# Patient Record
Sex: Male | Born: 1980 | Race: Black or African American | Hispanic: No | Marital: Single | Smoking: Never smoker
Health system: Southern US, Community
[De-identification: ages and names within clinical notes are randomized; demographics above are authoritative.]

## PROBLEM LIST (undated history)

## (undated) DIAGNOSIS — A539 Syphilis, unspecified: Secondary | ICD-10-CM

## (undated) DIAGNOSIS — Z21 Asymptomatic human immunodeficiency virus [HIV] infection status: Secondary | ICD-10-CM

## (undated) DIAGNOSIS — T7840XA Allergy, unspecified, initial encounter: Secondary | ICD-10-CM

## (undated) DIAGNOSIS — B20 Human immunodeficiency virus [HIV] disease: Secondary | ICD-10-CM

## (undated) HISTORY — DX: Allergy, unspecified, initial encounter: T78.40XA

## (undated) HISTORY — DX: Syphilis, unspecified: A53.9

## (undated) HISTORY — DX: Asymptomatic human immunodeficiency virus (hiv) infection status: Z21

## (undated) HISTORY — DX: Human immunodeficiency virus (HIV) disease: B20

---

## 2004-07-15 ENCOUNTER — Emergency Department (HOSPITAL_COMMUNITY): Admission: EM | Admit: 2004-07-15 | Discharge: 2004-07-16 | Payer: Self-pay | Admitting: Emergency Medicine

## 2005-02-07 ENCOUNTER — Emergency Department (HOSPITAL_COMMUNITY): Admission: EM | Admit: 2005-02-07 | Discharge: 2005-02-07 | Payer: Self-pay | Admitting: Emergency Medicine

## 2006-07-10 ENCOUNTER — Encounter: Admission: RE | Admit: 2006-07-10 | Discharge: 2006-07-10 | Payer: Self-pay | Admitting: Internal Medicine

## 2006-07-10 ENCOUNTER — Ambulatory Visit: Payer: Self-pay | Admitting: Internal Medicine

## 2006-07-10 ENCOUNTER — Encounter (INDEPENDENT_AMBULATORY_CARE_PROVIDER_SITE_OTHER): Payer: Self-pay | Admitting: *Deleted

## 2006-07-10 LAB — CONVERTED CEMR LAB
CD4 Count: 400 microliters
CD4 T Cell Abs: 400
HIV 1 RNA Quant: 35700 copies/mL

## 2006-07-25 ENCOUNTER — Ambulatory Visit: Payer: Self-pay | Admitting: Internal Medicine

## 2006-09-10 ENCOUNTER — Ambulatory Visit: Payer: Self-pay | Admitting: Internal Medicine

## 2006-09-29 DIAGNOSIS — B20 Human immunodeficiency virus [HIV] disease: Secondary | ICD-10-CM

## 2006-09-29 DIAGNOSIS — K921 Melena: Secondary | ICD-10-CM

## 2006-10-02 ENCOUNTER — Encounter (INDEPENDENT_AMBULATORY_CARE_PROVIDER_SITE_OTHER): Payer: Self-pay | Admitting: *Deleted

## 2006-10-02 ENCOUNTER — Encounter: Admission: RE | Admit: 2006-10-02 | Discharge: 2006-10-02 | Payer: Self-pay | Admitting: Internal Medicine

## 2006-10-02 ENCOUNTER — Ambulatory Visit: Payer: Self-pay | Admitting: Internal Medicine

## 2006-10-02 LAB — CONVERTED CEMR LAB
CD4 Count: 420 microliters
HIV 1 RNA Quant: 14400 copies/mL

## 2006-10-24 ENCOUNTER — Ambulatory Visit: Payer: Self-pay | Admitting: Internal Medicine

## 2006-12-23 ENCOUNTER — Encounter: Payer: Self-pay | Admitting: Internal Medicine

## 2006-12-26 ENCOUNTER — Ambulatory Visit: Payer: Self-pay | Admitting: Internal Medicine

## 2007-01-12 ENCOUNTER — Ambulatory Visit: Payer: Self-pay | Admitting: Internal Medicine

## 2007-01-12 ENCOUNTER — Encounter: Admission: RE | Admit: 2007-01-12 | Discharge: 2007-01-12 | Payer: Self-pay | Admitting: Internal Medicine

## 2007-01-12 ENCOUNTER — Encounter (INDEPENDENT_AMBULATORY_CARE_PROVIDER_SITE_OTHER): Payer: Self-pay | Admitting: *Deleted

## 2007-01-12 LAB — CONVERTED CEMR LAB
ALT: 14 units/L (ref 0–53)
AST: 16 units/L (ref 0–37)
Albumin: 4.1 g/dL (ref 3.5–5.2)
Basophils Absolute: 0 10*3/uL (ref 0.0–0.1)
Basophils Relative: 1 % (ref 0–1)
Calcium: 9.5 mg/dL (ref 8.4–10.5)
Creatinine, Ser: 0.88 mg/dL (ref 0.40–1.50)
Eosinophils Absolute: 0 10*3/uL (ref 0.0–0.7)
Eosinophils Relative: 1 % (ref 0–5)
HCT: 41.4 % (ref 39.0–52.0)
Lymphocytes Relative: 66 % — ABNORMAL HIGH (ref 12–46)
Neutro Abs: 0.9 10*3/uL — ABNORMAL LOW (ref 1.7–7.7)
Potassium: 3.9 meq/L (ref 3.5–5.3)

## 2007-01-19 ENCOUNTER — Encounter (INDEPENDENT_AMBULATORY_CARE_PROVIDER_SITE_OTHER): Payer: Self-pay | Admitting: *Deleted

## 2007-01-19 LAB — CONVERTED CEMR LAB

## 2007-01-23 ENCOUNTER — Ambulatory Visit: Payer: Self-pay | Admitting: Internal Medicine

## 2007-02-01 ENCOUNTER — Encounter (INDEPENDENT_AMBULATORY_CARE_PROVIDER_SITE_OTHER): Payer: Self-pay | Admitting: *Deleted

## 2007-02-06 ENCOUNTER — Ambulatory Visit: Payer: Self-pay | Admitting: Internal Medicine

## 2007-03-27 ENCOUNTER — Encounter: Payer: Self-pay | Admitting: Internal Medicine

## 2007-03-27 ENCOUNTER — Encounter: Admission: RE | Admit: 2007-03-27 | Discharge: 2007-03-27 | Payer: Self-pay | Admitting: Infectious Diseases

## 2007-03-27 ENCOUNTER — Ambulatory Visit: Payer: Self-pay | Admitting: Infectious Diseases

## 2007-03-27 LAB — CONVERTED CEMR LAB
Alkaline Phosphatase: 73 units/L (ref 39–117)
Calcium: 9.1 mg/dL (ref 8.4–10.5)
Creatinine, Ser: 0.97 mg/dL (ref 0.40–1.50)
Eosinophils Relative: 1 % (ref 0–5)
HCT: 41.6 % (ref 39.0–52.0)
HIV-1 RNA Quant, Log: 3.94 — ABNORMAL HIGH (ref <1.70)
Lymphocytes Relative: 55 % — ABNORMAL HIGH (ref 12–46)
MCHC: 33.7 g/dL (ref 30.0–36.0)
Monocytes Absolute: 0.5 10*3/uL (ref 0.2–0.7)
Monocytes Relative: 13 % — ABNORMAL HIGH (ref 3–11)
Neutro Abs: 1.2 10*3/uL — ABNORMAL LOW (ref 1.7–7.7)
Potassium: 3.8 meq/L (ref 3.5–5.3)
Total Bilirubin: 0.7 mg/dL (ref 0.3–1.2)
Total Protein: 8.2 g/dL (ref 6.0–8.3)
WBC: 3.8 10*3/uL — ABNORMAL LOW (ref 4.0–10.5)

## 2007-04-10 ENCOUNTER — Ambulatory Visit: Payer: Self-pay | Admitting: Internal Medicine

## 2007-04-10 DIAGNOSIS — L738 Other specified follicular disorders: Secondary | ICD-10-CM

## 2007-07-01 ENCOUNTER — Ambulatory Visit: Payer: Self-pay | Admitting: Internal Medicine

## 2007-07-01 ENCOUNTER — Encounter: Admission: RE | Admit: 2007-07-01 | Discharge: 2007-07-01 | Payer: Self-pay | Admitting: Internal Medicine

## 2007-07-01 LAB — CONVERTED CEMR LAB
ALT: 22 units/L (ref 0–53)
Alkaline Phosphatase: 79 units/L (ref 39–117)
BUN: 17 mg/dL (ref 6–23)
Calcium: 9.5 mg/dL (ref 8.4–10.5)
Creatinine, Ser: 0.97 mg/dL (ref 0.40–1.50)
Eosinophils Absolute: 0.1 10*3/uL (ref 0.0–0.7)
Eosinophils Relative: 1 % (ref 0–5)
HCT: 44.5 % (ref 39.0–52.0)
MCHC: 33.3 g/dL (ref 30.0–36.0)
MCV: 85.2 fL (ref 78.0–100.0)
Monocytes Absolute: 0.5 10*3/uL (ref 0.2–0.7)
Neutro Abs: 1.3 10*3/uL — ABNORMAL LOW (ref 1.7–7.7)
Potassium: 3.9 meq/L (ref 3.5–5.3)
RBC: 5.22 M/uL (ref 4.22–5.81)
RDW: 13.2 % (ref 11.5–14.0)
Total Bilirubin: 0.5 mg/dL (ref 0.3–1.2)
Total Protein: 8.2 g/dL (ref 6.0–8.3)
WBC: 4.2 10*3/uL (ref 4.0–10.5)

## 2007-07-06 ENCOUNTER — Telehealth: Payer: Self-pay | Admitting: Internal Medicine

## 2007-07-10 ENCOUNTER — Encounter (INDEPENDENT_AMBULATORY_CARE_PROVIDER_SITE_OTHER): Payer: Self-pay | Admitting: *Deleted

## 2007-08-07 ENCOUNTER — Ambulatory Visit: Payer: Self-pay | Admitting: Internal Medicine

## 2007-08-07 DIAGNOSIS — A630 Anogenital (venereal) warts: Secondary | ICD-10-CM | POA: Insufficient documentation

## 2007-11-02 ENCOUNTER — Encounter (INDEPENDENT_AMBULATORY_CARE_PROVIDER_SITE_OTHER): Payer: Self-pay | Admitting: *Deleted

## 2007-11-02 ENCOUNTER — Ambulatory Visit: Payer: Self-pay | Admitting: Internal Medicine

## 2007-11-02 ENCOUNTER — Encounter: Admission: RE | Admit: 2007-11-02 | Discharge: 2007-11-02 | Payer: Self-pay | Admitting: Internal Medicine

## 2007-11-02 LAB — CONVERTED CEMR LAB
ALT: 18 units/L (ref 0–53)
BUN: 13 mg/dL (ref 6–23)
Basophils Absolute: 0 10*3/uL (ref 0.0–0.1)
Basophils Relative: 1 % (ref 0–1)
Chloride: 106 meq/L (ref 96–112)
Eosinophils Absolute: 0 10*3/uL — ABNORMAL LOW (ref 0.2–0.7)
Eosinophils Relative: 1 % (ref 0–5)
HIV-1 RNA Quant, Log: 3.55 — ABNORMAL HIGH (ref <1.70)
Hemoglobin: 14.1 g/dL (ref 13.0–17.0)
Lymphs Abs: 2 10*3/uL (ref 0.7–4.0)
Monocytes Absolute: 0.4 10*3/uL (ref 0.1–1.0)
Monocytes Relative: 12 % (ref 3–12)
Neutrophils Relative %: 29 % — ABNORMAL LOW (ref 43–77)
RDW: 13.5 % (ref 11.5–15.5)
Sodium: 141 meq/L (ref 135–145)
Total Protein: 8.3 g/dL (ref 6.0–8.3)

## 2007-11-24 ENCOUNTER — Encounter: Payer: Self-pay | Admitting: Internal Medicine

## 2007-11-25 ENCOUNTER — Encounter (INDEPENDENT_AMBULATORY_CARE_PROVIDER_SITE_OTHER): Payer: Self-pay | Admitting: *Deleted

## 2007-11-25 ENCOUNTER — Ambulatory Visit: Payer: Self-pay | Admitting: Internal Medicine

## 2007-11-25 DIAGNOSIS — R110 Nausea: Secondary | ICD-10-CM

## 2007-12-12 ENCOUNTER — Encounter: Payer: Self-pay | Admitting: Internal Medicine

## 2007-12-18 ENCOUNTER — Telehealth (INDEPENDENT_AMBULATORY_CARE_PROVIDER_SITE_OTHER): Payer: Self-pay | Admitting: *Deleted

## 2007-12-18 ENCOUNTER — Encounter (INDEPENDENT_AMBULATORY_CARE_PROVIDER_SITE_OTHER): Payer: Self-pay | Admitting: *Deleted

## 2007-12-31 ENCOUNTER — Telehealth: Payer: Self-pay

## 2008-01-01 ENCOUNTER — Ambulatory Visit: Payer: Self-pay | Admitting: Internal Medicine

## 2008-01-01 DIAGNOSIS — T148 Other injury of unspecified body region: Secondary | ICD-10-CM

## 2008-01-01 DIAGNOSIS — W57XXXA Bitten or stung by nonvenomous insect and other nonvenomous arthropods, initial encounter: Secondary | ICD-10-CM

## 2008-01-01 DIAGNOSIS — J309 Allergic rhinitis, unspecified: Secondary | ICD-10-CM | POA: Insufficient documentation

## 2008-02-24 ENCOUNTER — Encounter: Admission: RE | Admit: 2008-02-24 | Discharge: 2008-02-24 | Payer: Self-pay | Admitting: Internal Medicine

## 2008-02-24 ENCOUNTER — Ambulatory Visit: Payer: Self-pay | Admitting: Internal Medicine

## 2008-02-24 LAB — CONVERTED CEMR LAB
AST: 18 units/L (ref 0–37)
Alkaline Phosphatase: 77 units/L (ref 39–117)
BUN: 17 mg/dL (ref 6–23)
Basophils Absolute: 0 10*3/uL (ref 0.0–0.1)
Basophils Relative: 1 % (ref 0–1)
Calcium: 9.5 mg/dL (ref 8.4–10.5)
Chloride: 104 meq/L (ref 96–112)
Creatinine, Ser: 0.85 mg/dL (ref 0.40–1.50)
Glucose, Bld: 89 mg/dL (ref 70–99)
HIV 1 RNA Quant: 1880 copies/mL — ABNORMAL HIGH (ref <50)
Hemoglobin: 14.3 g/dL (ref 13.0–17.0)
Lymphs Abs: 2 10*3/uL (ref 0.7–4.0)
MCHC: 33.9 g/dL (ref 30.0–36.0)
MCV: 85.6 fL (ref 78.0–100.0)
Monocytes Relative: 12 % (ref 3–12)
Potassium: 4.2 meq/L (ref 3.5–5.3)
Sodium: 140 meq/L (ref 135–145)

## 2008-03-09 ENCOUNTER — Ambulatory Visit: Payer: Self-pay | Admitting: Internal Medicine

## 2008-03-29 ENCOUNTER — Encounter: Admission: RE | Admit: 2008-03-29 | Discharge: 2008-03-29 | Payer: Self-pay | Admitting: Internal Medicine

## 2008-03-29 ENCOUNTER — Telehealth: Payer: Self-pay | Admitting: Infectious Diseases

## 2008-03-29 ENCOUNTER — Ambulatory Visit: Payer: Self-pay | Admitting: Internal Medicine

## 2008-03-29 DIAGNOSIS — L259 Unspecified contact dermatitis, unspecified cause: Secondary | ICD-10-CM | POA: Insufficient documentation

## 2008-04-01 ENCOUNTER — Ambulatory Visit: Payer: Self-pay | Admitting: Internal Medicine

## 2008-04-01 DIAGNOSIS — A539 Syphilis, unspecified: Secondary | ICD-10-CM

## 2008-04-08 ENCOUNTER — Ambulatory Visit: Payer: Self-pay | Admitting: Internal Medicine

## 2008-04-15 ENCOUNTER — Ambulatory Visit: Payer: Self-pay | Admitting: Internal Medicine

## 2008-06-07 ENCOUNTER — Ambulatory Visit: Payer: Self-pay | Admitting: Internal Medicine

## 2008-06-07 ENCOUNTER — Encounter: Admission: RE | Admit: 2008-06-07 | Discharge: 2008-06-07 | Payer: Self-pay | Admitting: Internal Medicine

## 2008-06-07 LAB — CONVERTED CEMR LAB
AST: 17 units/L (ref 0–37)
Albumin: 4.5 g/dL (ref 3.5–5.2)
Basophils Relative: 0 % (ref 0–1)
CO2: 23 meq/L (ref 19–32)
Creatinine, Ser: 0.93 mg/dL (ref 0.40–1.50)
Eosinophils Absolute: 0 10*3/uL (ref 0.0–0.7)
HCT: 41 % (ref 39.0–52.0)
HIV-1 RNA Quant, Log: 3.51 — ABNORMAL HIGH (ref <1.70)
Hemoglobin: 14.2 g/dL (ref 13.0–17.0)
Lymphocytes Relative: 51 % — ABNORMAL HIGH (ref 12–46)
MCHC: 34.6 g/dL (ref 30.0–36.0)
MCV: 81.8 fL (ref 78.0–100.0)
Monocytes Relative: 9 % (ref 3–12)
Neutro Abs: 1.8 10*3/uL (ref 1.7–7.7)
Platelets: 223 10*3/uL (ref 150–400)
Potassium: 4.4 meq/L (ref 3.5–5.3)
RDW: 13.2 % (ref 11.5–15.5)

## 2008-09-23 ENCOUNTER — Encounter (INDEPENDENT_AMBULATORY_CARE_PROVIDER_SITE_OTHER): Payer: Self-pay | Admitting: Licensed Clinical Social Worker

## 2008-09-23 ENCOUNTER — Ambulatory Visit: Payer: Self-pay | Admitting: Internal Medicine

## 2008-09-26 ENCOUNTER — Ambulatory Visit: Payer: Self-pay | Admitting: Internal Medicine

## 2008-09-26 LAB — CONVERTED CEMR LAB
ALT: 13 units/L (ref 0–53)
AST: 19 units/L (ref 0–37)
Basophils Absolute: 0 10*3/uL (ref 0.0–0.1)
Basophils Relative: 0 % (ref 0–1)
CO2: 22 meq/L (ref 19–32)
Creatinine, Ser: 0.9 mg/dL (ref 0.40–1.50)
Eosinophils Relative: 1 % (ref 0–5)
HIV 1 RNA Quant: 3590 copies/mL — ABNORMAL HIGH (ref <50)
HIV-1 RNA Quant, Log: 3.56 — ABNORMAL HIGH (ref <1.70)
Hemoglobin: 13.7 g/dL (ref 13.0–17.0)
Lymphocytes Relative: 58 % — ABNORMAL HIGH (ref 12–46)
Lymphs Abs: 2.3 10*3/uL (ref 0.7–4.0)
MCV: 85 fL (ref 78.0–100.0)
Monocytes Absolute: 0.5 10*3/uL (ref 0.1–1.0)
Monocytes Relative: 12 % (ref 3–12)
Neutro Abs: 1.1 10*3/uL — ABNORMAL LOW (ref 1.7–7.7)
Platelets: 226 10*3/uL (ref 150–400)
Potassium: 4.5 meq/L (ref 3.5–5.3)
Sodium: 139 meq/L (ref 135–145)

## 2008-10-12 ENCOUNTER — Ambulatory Visit: Payer: Self-pay | Admitting: Internal Medicine

## 2008-10-13 ENCOUNTER — Encounter (INDEPENDENT_AMBULATORY_CARE_PROVIDER_SITE_OTHER): Payer: Self-pay | Admitting: *Deleted

## 2009-01-10 ENCOUNTER — Ambulatory Visit: Payer: Self-pay | Admitting: Internal Medicine

## 2009-01-10 LAB — CONVERTED CEMR LAB
AST: 14 units/L (ref 0–37)
Alkaline Phosphatase: 73 units/L (ref 39–117)
BUN: 14 mg/dL (ref 6–23)
Basophils Absolute: 0 10*3/uL (ref 0.0–0.1)
Basophils Relative: 1 % (ref 0–1)
Calcium: 9.8 mg/dL (ref 8.4–10.5)
Chloride: 106 meq/L (ref 96–112)
HCT: 41.1 % (ref 39.0–52.0)
HIV-1 RNA Quant, Log: 3.46 — ABNORMAL HIGH (ref <1.68)
Hemoglobin: 14.4 g/dL (ref 13.0–17.0)
Lymphocytes Relative: 39 % (ref 12–46)
Lymphs Abs: 1.6 10*3/uL (ref 0.7–4.0)
Monocytes Absolute: 0.4 10*3/uL (ref 0.1–1.0)
Potassium: 4.2 meq/L (ref 3.5–5.3)
RBC: 4.92 M/uL (ref 4.22–5.81)
RDW: 12.7 % (ref 11.5–15.5)
Total Bilirubin: 0.4 mg/dL (ref 0.3–1.2)
Total Protein: 8.2 g/dL (ref 6.0–8.3)

## 2009-01-25 ENCOUNTER — Ambulatory Visit: Payer: Self-pay | Admitting: Internal Medicine

## 2009-01-25 DIAGNOSIS — J029 Acute pharyngitis, unspecified: Secondary | ICD-10-CM

## 2009-01-25 LAB — CONVERTED CEMR LAB

## 2009-04-18 ENCOUNTER — Ambulatory Visit: Payer: Self-pay | Admitting: Internal Medicine

## 2009-05-03 ENCOUNTER — Telehealth: Payer: Self-pay | Admitting: Internal Medicine

## 2009-05-26 ENCOUNTER — Ambulatory Visit: Payer: Self-pay | Admitting: Internal Medicine

## 2009-05-26 LAB — CONVERTED CEMR LAB
ALT: 17 units/L (ref 0–53)
Alkaline Phosphatase: 84 units/L (ref 39–117)
Basophils Relative: 1 % (ref 0–1)
CO2: 25 meq/L (ref 19–32)
Creatinine, Ser: 0.85 mg/dL (ref 0.40–1.50)
HCT: 37.9 % — ABNORMAL LOW (ref 39.0–52.0)
Lymphocytes Relative: 46 % (ref 12–46)
Lymphs Abs: 1.6 10*3/uL (ref 0.7–4.0)
MCHC: 35.6 g/dL (ref 30.0–36.0)
MCV: 81.9 fL (ref 78.0–100.0)
Monocytes Absolute: 0.3 10*3/uL (ref 0.1–1.0)
Neutro Abs: 1.5 10*3/uL — ABNORMAL LOW (ref 1.7–7.7)
Sodium: 140 meq/L (ref 135–145)
Total Bilirubin: 0.3 mg/dL (ref 0.3–1.2)
WBC: 3.4 10*3/uL — ABNORMAL LOW (ref 4.0–10.5)

## 2009-07-21 ENCOUNTER — Telehealth (INDEPENDENT_AMBULATORY_CARE_PROVIDER_SITE_OTHER): Payer: Self-pay | Admitting: Licensed Clinical Social Worker

## 2011-03-12 LAB — T-HELPER CELL (CD4) - (RCID CLINIC ONLY)
CD4 % Helper T Cell: 19 % — ABNORMAL LOW (ref 33–55)
CD4 T Cell Abs: 290 uL — ABNORMAL LOW (ref 400–2700)

## 2011-06-14 DIAGNOSIS — Z8619 Personal history of other infectious and parasitic diseases: Secondary | ICD-10-CM | POA: Insufficient documentation

## 2011-08-20 LAB — T-HELPER CELL (CD4) - (RCID CLINIC ONLY)
CD4 % Helper T Cell: 15 — ABNORMAL LOW
CD4 T Cell Abs: 310 — ABNORMAL LOW

## 2011-08-22 LAB — T-HELPER CELL (CD4) - (RCID CLINIC ONLY): CD4 T Cell Abs: 320 — ABNORMAL LOW

## 2011-09-02 LAB — T-HELPER CELL (CD4) - (RCID CLINIC ONLY)
CD4 % Helper T Cell: 16 — ABNORMAL LOW
CD4 T Cell Abs: 320 — ABNORMAL LOW

## 2011-09-09 LAB — T-HELPER CELL (CD4) - (RCID CLINIC ONLY): CD4 T Cell Abs: 350 — ABNORMAL LOW

## 2011-12-12 ENCOUNTER — Ambulatory Visit (INDEPENDENT_AMBULATORY_CARE_PROVIDER_SITE_OTHER): Payer: Self-pay

## 2011-12-12 DIAGNOSIS — B20 Human immunodeficiency virus [HIV] disease: Secondary | ICD-10-CM

## 2011-12-12 DIAGNOSIS — Z79899 Other long term (current) drug therapy: Secondary | ICD-10-CM

## 2011-12-12 DIAGNOSIS — Z8619 Personal history of other infectious and parasitic diseases: Secondary | ICD-10-CM

## 2011-12-12 DIAGNOSIS — Z113 Encounter for screening for infections with a predominantly sexual mode of transmission: Secondary | ICD-10-CM

## 2011-12-12 LAB — URINALYSIS
Glucose, UA: NEGATIVE mg/dL
Ketones, ur: NEGATIVE mg/dL
Protein, ur: NEGATIVE mg/dL
Specific Gravity, Urine: 1.029 (ref 1.005–1.030)
Urobilinogen, UA: 0.2 mg/dL (ref 0.0–1.0)

## 2011-12-12 LAB — CBC WITH DIFFERENTIAL/PLATELET
Basophils Relative: 1 % (ref 0–1)
Eosinophils Absolute: 0 10*3/uL (ref 0.0–0.7)
HCT: 38.7 % — ABNORMAL LOW (ref 39.0–52.0)
Hemoglobin: 13.3 g/dL (ref 13.0–17.0)
MCH: 28.4 pg (ref 26.0–34.0)
MCV: 82.7 fL (ref 78.0–100.0)
Neutro Abs: 1.2 10*3/uL — ABNORMAL LOW (ref 1.7–7.7)
Neutrophils Relative %: 35 % — ABNORMAL LOW (ref 43–77)
Platelets: 237 10*3/uL (ref 150–400)

## 2011-12-12 LAB — COMPLETE METABOLIC PANEL WITH GFR
ALT: 12 U/L (ref 0–53)
Alkaline Phosphatase: 61 U/L (ref 39–117)
BUN: 11 mg/dL (ref 6–23)
Creat: 0.95 mg/dL (ref 0.50–1.35)
Glucose, Bld: 79 mg/dL (ref 70–99)
Potassium: 3.9 mEq/L (ref 3.5–5.3)
Total Protein: 7.4 g/dL (ref 6.0–8.3)

## 2011-12-12 LAB — LIPID PANEL: Cholesterol: 167 mg/dL (ref 0–200)

## 2011-12-13 LAB — GC/CHLAMYDIA PROBE AMP, URINE
Chlamydia, Swab/Urine, PCR: NEGATIVE
GC Probe Amp, Urine: NEGATIVE

## 2011-12-13 LAB — HEPATITIS A ANTIBODY, TOTAL: Hep A Total Ab: NEGATIVE

## 2011-12-13 LAB — HEPATITIS B CORE ANTIBODY, TOTAL: Hep B Core Total Ab: NEGATIVE

## 2011-12-13 LAB — T-HELPER CELL (CD4) - (RCID CLINIC ONLY): CD4 T Cell Abs: 450 uL (ref 400–2700)

## 2011-12-13 LAB — RPR

## 2011-12-19 MED ORDER — ATAZANAVIR SULFATE 200 MG PO CAPS: 400.0000 mg | ORAL_CAPSULE | Freq: Every day | ORAL | Status: DC

## 2011-12-19 MED ORDER — ABACAVIR SULFATE-LAMIVUDINE 600-300 MG PO TABS: 1.0000 | ORAL_TABLET | Freq: Every day | ORAL | Status: DC

## 2011-12-19 NOTE — Progress Notes (Signed)
Patient returning after moving care to [facility].  Record support his non adherence which was the same as before the transfer. He is transferring his care due to transportation issues.

## 2011-12-24 LAB — HIV-1 GENOTYPR PLUS

## 2011-12-26 ENCOUNTER — Encounter: Payer: Self-pay | Admitting: Internal Medicine

## 2011-12-26 ENCOUNTER — Ambulatory Visit (INDEPENDENT_AMBULATORY_CARE_PROVIDER_SITE_OTHER): Payer: Self-pay | Admitting: Internal Medicine

## 2011-12-26 DIAGNOSIS — B20 Human immunodeficiency virus [HIV] disease: Secondary | ICD-10-CM

## 2011-12-27 ENCOUNTER — Encounter: Payer: Self-pay | Admitting: Internal Medicine

## 2011-12-27 LAB — T-HELPER CELLS (CD4) COUNT (NOT AT ARMC): CD4 T Cell Abs: 428

## 2011-12-27 NOTE — Assessment & Plan Note (Signed)
I discussed with the patient at length the process of developing resistance if medication not taken daily.  The patient is very aware of the consequences and is not interested in trying to take the medications daily.  After further discussion of the outcome of untreated HIV, the likelihood of resistance with poor compliance, the patient has opted to not pursue therapy at this time.  He will continue to think about therapy and return in 1 month for further discussion.  No indication for prophylaxis at this time.

## 2011-12-27 NOTE — Progress Notes (Signed)
  Subjective:    Patient ID: Cole Thomas, male    DOB: 1981/04/24, 31 y.o.   MRN: [id number]  HPI 31 yo with HIV and previously being followed at [facility] on a regimen of Epzicom and unboosted Reyataz here to establish care.  He has been out of medications for several months and is changing due to lack of transportation.  He has no complaints at this time.  He was previously on Atripla but devloped a rash, then felt N/V with Truvada but has tolerated Epzicom and Reyataz well. No recent hospitalizations, no OIs, no STIs.  He tells me that when he is on medications he frequently misses doses and does not feel he will be able to take his regimen daily.  He does not seem concerned about a poor outcome.     Review of Systems  Constitutional: Negative for fever, chills, fatigue and unexpected weight change.  HENT: Negative for sore throat and trouble swallowing.   Respiratory: Negative for cough, shortness of breath and wheezing.   Cardiovascular: Negative for chest pain, palpitations and leg swelling.  Gastrointestinal: Negative for nausea, vomiting and abdominal pain.  Genitourinary: Negative for discharge and penile pain.  Musculoskeletal: Negative for myalgias and arthralgias.  Skin: Negative for pallor and rash.  Neurological: Negative for dizziness, syncope, weakness, light-headedness and headaches.  Hematological: Negative for adenopathy.  Psychiatric/Behavioral: Negative for dysphoric mood. The patient is not nervous/anxious.        Objective:   Physical Exam  Constitutional: He is oriented to person, place, and time. He appears well-developed and well-nourished. No distress.  HENT:  Mouth/Throat: Oropharynx is clear and moist. No oropharyngeal exudate.  Cardiovascular: Normal rate, regular rhythm and normal heart sounds.  Exam reveals no gallop and no friction rub.   No murmur Thomas. Pulmonary/Chest: Effort normal and breath sounds normal. No respiratory distress. He has no wheezes. He has  no rales.  Abdominal: Soft. Bowel sounds are normal. He exhibits no distension. There is no tenderness. There is no rebound.  Lymphadenopathy:    He has no cervical adenopathy.  Neurological: He is alert and oriented to person, place, and time.  Skin: Skin is warm and dry. No rash noted. No erythema.  Psychiatric: He has a normal mood and affect. His behavior is normal. Judgment and thought content normal.          Assessment & Plan:

## 2012-01-21 LAB — RPR: RPR Ser Ql: NONREACTIVE

## 2012-01-23 ENCOUNTER — Encounter: Payer: Self-pay | Admitting: Internal Medicine

## 2012-01-23 ENCOUNTER — Ambulatory Visit: Payer: Self-pay

## 2012-01-23 ENCOUNTER — Ambulatory Visit (INDEPENDENT_AMBULATORY_CARE_PROVIDER_SITE_OTHER): Payer: Self-pay | Admitting: Internal Medicine

## 2012-01-23 VITALS — BP 111/71 | HR 77 | Temp 98.0°F | Ht 67.0 in | Wt 152.0 lb

## 2012-01-23 DIAGNOSIS — B20 Human immunodeficiency virus [HIV] disease: Secondary | ICD-10-CM

## 2012-01-23 NOTE — Progress Notes (Signed)
  Subjective:    Patient ID: Cole Thomas, male    DOB: 02/25/1981, 31 y.o.   MRN: [id number]  HPI Here for follow up of 042.  He was seen as a new patient last month as a transfer from [facility] and had been on Reyataz and Epzicom but stopped taking it.  He had previously taken it with frequent missed doses and decided he was not "ready".  He returns to continue to discuss medication.  He tells me he is still not ready to make a "commitment" to taking medicine daily.     Review of Systems  Constitutional: Negative for fever, chills and unexpected weight change.  HENT: Negative for sore throat and trouble swallowing.   Respiratory: Negative for cough, chest tightness, shortness of breath and wheezing.   Cardiovascular: Negative for chest pain, palpitations and leg swelling.  Gastrointestinal: Negative for nausea, vomiting, abdominal pain and diarrhea.  Genitourinary: Negative for discharge and penile swelling.  Musculoskeletal: Negative for myalgias and arthralgias.  Skin: Negative for pallor and rash.  Neurological: Negative for weakness and headaches.  Hematological: Negative for adenopathy.  Psychiatric/Behavioral: Negative for sleep disturbance and dysphoric mood. The patient is not nervous/anxious.        Objective:   Physical Exam  Constitutional: He appears well-developed and well-nourished. No distress.  HENT:  Mouth/Throat: Oropharynx is clear and moist. No oropharyngeal exudate.  Cardiovascular: Normal rate, regular rhythm and normal heart sounds.  Exam reveals no gallop and no friction rub.   No murmur heard. Pulmonary/Chest: Breath sounds normal. No respiratory distress. He has no wheezes. He has no rales.  Abdominal: Soft. Bowel sounds are normal. He exhibits no distension. There is no tenderness. There is no rebound.  Lymphadenopathy:    He has no cervical adenopathy.          Assessment & Plan:

## 2012-01-23 NOTE — Assessment & Plan Note (Addendum)
He continues to not want to take ARV therapy.  I extensively warned him of the perils of this approach including the risk of death, severe illness and likely his CD4 cells will decrease more rapidly because he had been on therapy before.  He voiced his understanding and still does not want to start.  He will return in 6 months for further discussion and can return sooner if he changes his mind.    He was reminded to use condoms with all sex.

## 2012-07-09 ENCOUNTER — Other Ambulatory Visit: Payer: Self-pay

## 2012-07-22 ENCOUNTER — Other Ambulatory Visit (INDEPENDENT_AMBULATORY_CARE_PROVIDER_SITE_OTHER): Payer: Managed Care, Other (non HMO)

## 2012-07-22 DIAGNOSIS — B20 Human immunodeficiency virus [HIV] disease: Secondary | ICD-10-CM

## 2012-07-23 ENCOUNTER — Ambulatory Visit: Payer: Self-pay | Admitting: Internal Medicine

## 2012-07-23 LAB — HIV-1 RNA QUANT-NO REFLEX-BLD
HIV 1 RNA Quant: 30484 copies/mL — ABNORMAL HIGH (ref <20)
HIV-1 RNA Quant, Log: 4.48 {Log} — ABNORMAL HIGH (ref <1.30)

## 2012-07-23 LAB — T-HELPER CELL (CD4) - (RCID CLINIC ONLY)
CD4 % Helper T Cell: 18 % — ABNORMAL LOW (ref 33–55)
CD4 T Cell Abs: 420 uL (ref 400–2700)

## 2012-07-27 ENCOUNTER — Emergency Department (HOSPITAL_COMMUNITY)
Admission: EM | Admit: 2012-07-27 | Discharge: 2012-07-27 | Disposition: A | Discharge status: Home / Self Care | Payer: Managed Care, Other (non HMO) | Attending: Emergency Medicine | Admitting: Emergency Medicine

## 2012-07-27 DIAGNOSIS — S61219A Laceration without foreign body of unspecified finger without damage to nail, initial encounter: Secondary | ICD-10-CM

## 2012-07-27 DIAGNOSIS — Y998 Other external cause status: Secondary | ICD-10-CM | POA: Insufficient documentation

## 2012-07-27 DIAGNOSIS — Y93G1 Activity, food preparation and clean up: Secondary | ICD-10-CM | POA: Insufficient documentation

## 2012-07-27 DIAGNOSIS — IMO0002 Reserved for concepts with insufficient information to code with codable children: Secondary | ICD-10-CM | POA: Insufficient documentation

## 2012-07-27 DIAGNOSIS — W260XXA Contact with knife, initial encounter: Secondary | ICD-10-CM | POA: Insufficient documentation

## 2012-07-27 NOTE — ED Notes (Signed)
Pt has small lac to top of L thumb. Bleeding controlled. Pt states he was using a new knife to cut a piece of plastic.

## 2012-07-27 NOTE — ED Notes (Signed)
Wound to L thumb dressed with Bacitracin and Band-Aid.

## 2012-07-27 NOTE — ED Provider Notes (Signed)
History     CSN: [id number]  Arrival date & time 07/27/12  2154   First MD Initiated Contact with Patient 07/27/12 2208      Chief Complaint  Patient presents with  . Laceration    (Consider location/radiation/quality/duration/timing/severity/associated sxs/prior treatment) HPI Comments: Patient reports that just prior to arrival he sustained a small abrasion to the left thumb while cutting food with a knife.  Bleeding controlled at this time.  Pain is mild.  Last tetanus was one year ago.  He denies numbness or tingling.  He has cleaned the wound well prior to arrival.  He has not taken anything for pain.  The history is provided by the patient.    Past Medical History  Diagnosis Date  . HIV infection     No past surgical history on file.  No family history on file.  History  Substance Use Topics  . Smoking status: Never Smoker   . Smokeless tobacco: Never Used  . Alcohol Use: No      Review of Systems  Gastrointestinal: Negative for nausea and vomiting.  Musculoskeletal: Negative for joint swelling.  Skin: Positive for wound.  Neurological: Negative for numbness.    Allergies  Atripla  Home Medications   Current Outpatient Rx  Name Route Sig Dispense Refill  . ABACAVIR SULFATE-LAMIVUDINE 600-300 MG PO TABS Oral Take 1 tablet by mouth daily. 30 tablet 11  . ATAZANAVIR SULFATE 200 MG PO CAPS Oral Take 2 capsules (400 mg total) by mouth daily with breakfast. 60 capsule 11    BP 110/75  Pulse 88  Temp 98.2 F (36.8 C) (Oral)  Resp 16  SpO2 99%  Physical Exam  Nursing note and vitals reviewed. Constitutional: He appears well-developed and well-nourished. No distress.  HENT:  Head: Normocephalic and atraumatic.  Cardiovascular: Normal rate, regular rhythm and normal heart sounds.   Pulses:      Radial pulses are 2+ on the right side, and 2+ on the left side.  Pulmonary/Chest: Effort normal and breath sounds normal.  Neurological: He is alert. No  sensory deficit.  Skin: Skin is warm and dry. He is not diaphoretic.       Very small abrasion to the dorsal distal left thumb proximal to the finger nail  Psychiatric: He has a normal mood and affect.    ED Course  Procedures (including critical care time)  Labs Reviewed - No data to display No results found.   No diagnosis found.    MDM  Patient presenting after cutting his finger with a knife.  Very superficial.  Sutures and dermaond not indicated.  Tetanus UTD.  Neurovascularly intact.  Patient discharge home.        Pascal Lux [place], PA-C 07/28/12 2249

## 2012-07-29 NOTE — ED Provider Notes (Signed)
Medical screening examination/treatment/procedure(s) were performed by non-physician practitioner and as supervising physician I was immediately available for consultation/collaboration.    Demetrios Byron R Seena Ritacco, MD 07/29/12 1100 

## 2012-08-06 ENCOUNTER — Ambulatory Visit (INDEPENDENT_AMBULATORY_CARE_PROVIDER_SITE_OTHER): Payer: Managed Care, Other (non HMO) | Admitting: Internal Medicine

## 2012-08-06 VITALS — BP 108/69 | HR 74 | Temp 97.6°F | Ht 67.0 in | Wt 161.0 lb

## 2012-08-06 DIAGNOSIS — Z23 Encounter for immunization: Secondary | ICD-10-CM

## 2012-08-06 DIAGNOSIS — B20 Human immunodeficiency virus [HIV] disease: Secondary | ICD-10-CM

## 2012-08-06 MED ORDER — ELVITEG-COBIC-EMTRICIT-TENOFDF 150-150-200-300 MG PO TABS: 1.0000 | ORAL_TABLET | Freq: Every day | ORAL | Status: DC

## 2012-08-06 NOTE — Assessment & Plan Note (Signed)
After discussing the benefits of treatment, I did discuss with him different treatment options. He has had some intolerance to some medications however by his admission feels a lot of this was due to simply not wanting to take medication. Therefore I do not feel there are any limitations to what he can tolerate. I did discuss different options including one pill a day options. He has opted to start Stribild. I did let him know that the components of Truvada are also Stribild but he does endorse the fact that he does not feel it was a true allergy. I did discuss the side effects of Stribild. I did express that he should not stop the medication without calling us first. I also told him that he may experience some fatigue while his virus becomes suppressed and other issues but that this will resolve once his virus becomes well suppressed. He will return in 4 weeks for repeat labs and I will see him 2 weeks after that

## 2012-08-06 NOTE — Patient Instructions (Signed)
Call first if you have any difficulty with the medicine

## 2012-08-06 NOTE — Progress Notes (Signed)
  Subjective:    Patient ID: Cole Thomas, male    DOB: Mar 25, 1981, 31 y.o.   MRN: [id number]  HPI He comes in for followup of HIV. He was a new patient to this clinic this year after transferring his care from [facility]. His previous regimens have included Atripla, Reyataz and Truvada, and then Reyataz and Epzicom all of which he said he did not tolerate very well mainly related to nausea and vomiting. In further exploring this it seems like he had a little bit of nausea and really no vomiting and maybe some mild abdominal pain. There was no rash or other intolerances or true allergies to any of these medications.  It really appears that he was resistant to taking medication and was not in a good place to start. Since coming to this clinic, he also has been against starting medications but comes in today interested in starting antiretroviral therapy. He has no particular complaints and feels well.   Review of Systems  Constitutional: Negative for fever, fatigue and unexpected weight change.  HENT: Negative for sore throat and trouble swallowing.   Respiratory: Negative for cough and shortness of breath.   Cardiovascular: Negative for palpitations.  Gastrointestinal: Negative for nausea, vomiting, abdominal pain, diarrhea and constipation.  Musculoskeletal: Negative for myalgias, joint swelling and arthralgias.  Skin: Negative for rash.  Neurological: Negative for dizziness and headaches.       Objective:   Physical Exam  Constitutional: He appears well-developed and well-nourished. No distress.  Cardiovascular: Normal rate, regular rhythm and normal heart sounds.  Exam reveals no gallop and no friction rub.   No murmur heard. Psychiatric: He has a normal mood and affect. His behavior is normal.          Assessment & Plan:

## 2012-09-07 ENCOUNTER — Telehealth: Payer: Self-pay | Admitting: *Deleted

## 2012-09-07 NOTE — Telephone Encounter (Signed)
Patient called and advised he is an employee of [employer] and wants to have his labs done there as he gets them for free with their insurance. Advised the patient we can write out the orders and he can take it to the [facility] facility to have them done and they can be faxed to [place] once resulted. Advised will leave the orders up front with staff for pick up.

## 2012-09-08 ENCOUNTER — Other Ambulatory Visit: Payer: Managed Care, Other (non HMO)

## 2012-09-29 ENCOUNTER — Ambulatory Visit (INDEPENDENT_AMBULATORY_CARE_PROVIDER_SITE_OTHER): Payer: Managed Care, Other (non HMO) | Admitting: Internal Medicine

## 2012-09-29 ENCOUNTER — Encounter: Payer: Self-pay | Admitting: Internal Medicine

## 2012-09-29 VITALS — BP 122/79 | HR 80 | Temp 98.5°F | Ht 66.0 in | Wt 163.0 lb

## 2012-09-29 DIAGNOSIS — B20 Human immunodeficiency virus [HIV] disease: Secondary | ICD-10-CM

## 2012-09-29 MED ORDER — ONDANSETRON HCL 4 MG PO TABS: 4.0000 mg | ORAL_TABLET | Freq: Three times a day (TID) | ORAL | Status: DC | PRN

## 2012-09-29 NOTE — Assessment & Plan Note (Addendum)
I did discuss with him that he will need to continue taking the medication in order to get his viral load suppressed and likely will improve once he has been suppressed for a number of months. He did voice his understanding and is going to continue with his medications. I will check him today since he has not had recent labs and again in 3 months. He will be called sooner if there is concerns on today's labs.  More than 30 minutes was spent including counseling by myself and pharmacy.

## 2012-09-29 NOTE — Progress Notes (Signed)
  Subjective:    Patient ID: Cole Thomas, male    DOB: 05-01-1981, 31 y.o.   MRN: [id number]  HPI He comes in for followup of HIV. He was a new patient to this clinic this year after transferring his care from [facility]. His previous regimens have included Atripla, Reyataz and Truvada, and then Reyataz and Epzicom all of which he said he did not tolerate very well mainly related to nausea and vomiting.  He has been started on Stribild and tells me he is taking it though did miss 3 days to 2 nausea. He is somewhat reluctant to continue to take it though understands that all the medications have made him nauseous and likely needs to continue to take the medicine to get over the nausea. He is not interested in other nausea medicine. He has no weight loss nor vomiting.    Review of Systems  Constitutional: Negative for fever, appetite change, fatigue and unexpected weight change.  HENT: Negative for sore throat and trouble swallowing.   Respiratory: Negative for cough and shortness of breath.   Cardiovascular: Negative for palpitations and leg swelling.  Gastrointestinal: Positive for nausea. Negative for vomiting and diarrhea.  Musculoskeletal: Negative for myalgias, joint swelling and arthralgias.       Objective:   Physical Exam  Constitutional: He appears well-developed and well-nourished. No distress.  Cardiovascular: Normal rate, regular rhythm and normal heart sounds.  Exam reveals no gallop and no friction rub.   No murmur heard. Pulmonary/Chest: Effort normal and breath sounds normal. No respiratory distress. He has no wheezes. He has no rales.          Assessment & Plan:

## 2012-09-30 ENCOUNTER — Encounter: Payer: Self-pay | Admitting: Infectious Disease

## 2012-11-09 ENCOUNTER — Encounter: Payer: Self-pay | Admitting: Internal Medicine

## 2013-01-28 ENCOUNTER — Ambulatory Visit: Payer: Managed Care, Other (non HMO) | Admitting: Internal Medicine

## 2013-02-11 ENCOUNTER — Ambulatory Visit: Payer: Managed Care, Other (non HMO) | Admitting: Internal Medicine

## 2013-02-11 ENCOUNTER — Ambulatory Visit (INDEPENDENT_AMBULATORY_CARE_PROVIDER_SITE_OTHER): Payer: Managed Care, Other (non HMO) | Admitting: Infectious Disease

## 2013-02-11 ENCOUNTER — Encounter: Payer: Self-pay | Admitting: Infectious Disease

## 2013-02-11 ENCOUNTER — Encounter: Payer: Self-pay | Admitting: Licensed Clinical Social Worker

## 2013-02-11 ENCOUNTER — Other Ambulatory Visit (HOSPITAL_COMMUNITY)
Admission: RE | Admit: 2013-02-11 | Discharge: 2013-02-11 | Disposition: A | Discharge status: Home / Self Care | Payer: Managed Care, Other (non HMO) | Source: Ambulatory Visit | Attending: Infectious Disease | Admitting: Infectious Disease

## 2013-02-11 VITALS — BP 123/86 | HR 132 | Temp 98.8°F | Wt 150.2 lb

## 2013-02-11 DIAGNOSIS — A630 Anogenital (venereal) warts: Secondary | ICD-10-CM | POA: Insufficient documentation

## 2013-02-11 DIAGNOSIS — R300 Dysuria: Secondary | ICD-10-CM | POA: Insufficient documentation

## 2013-02-11 DIAGNOSIS — Z9119 Patient's noncompliance with other medical treatment and regimen: Secondary | ICD-10-CM

## 2013-02-11 DIAGNOSIS — K6289 Other specified diseases of anus and rectum: Secondary | ICD-10-CM

## 2013-02-11 DIAGNOSIS — Z21 Asymptomatic human immunodeficiency virus [HIV] infection status: Secondary | ICD-10-CM

## 2013-02-11 DIAGNOSIS — Z91199 Patient's noncompliance with other medical treatment and regimen due to unspecified reason: Secondary | ICD-10-CM

## 2013-02-11 DIAGNOSIS — B20 Human immunodeficiency virus [HIV] disease: Secondary | ICD-10-CM

## 2013-02-11 DIAGNOSIS — K602 Anal fissure, unspecified: Secondary | ICD-10-CM

## 2013-02-11 LAB — T-HELPER CELLS (CD4) COUNT (NOT AT ARMC): CD4 T Cell Abs: 382

## 2013-02-11 MED ORDER — CEFTRIAXONE SODIUM 1 G IJ SOLR
250.0000 mg | Freq: Once | INTRAMUSCULAR | Status: AC
  Administered 2013-02-11: 250 mg via INTRAMUSCULAR

## 2013-02-11 MED ORDER — DOXYCYCLINE HYCLATE 100 MG PO TABS: 100.0000 mg | ORAL_TABLET | Freq: Two times a day (BID) | ORAL | Status: DC

## 2013-02-11 MED ORDER — LIDOCAINE 5 % EX OINT: TOPICAL_OINTMENT | Freq: Two times a day (BID) | CUTANEOUS | Status: DC

## 2013-02-11 MED ORDER — AZITHROMYCIN 250 MG PO TABS
1000.0000 mg | ORAL_TABLET | Freq: Once | ORAL | Status: AC
  Administered 2013-02-11: 1000 mg via ORAL

## 2013-02-11 MED ORDER — DOLUTEGRAVIR SODIUM 50 MG PO TABS: 50.0000 mg | ORAL_TABLET | Freq: Every day | ORAL | Status: DC

## 2013-02-11 MED ORDER — EMTRICITABINE-TENOFOVIR DF 200-300 MG PO TABS: 1.0000 | ORAL_TABLET | Freq: Every day | ORAL | Status: DC

## 2013-02-11 NOTE — Progress Notes (Signed)
Subjective:    Patient ID: Cole Thomas, male    DOB: 11/11/1981, 32 y.o.   MRN: [id number]  Dysuria  Associated symptoms include urgency. Pertinent negatives include no chills, flank pain, hematuria, nausea or vomiting.    32 year old [place] American man with HIV who is been poorly compliant with his antiretroviral regimens. He was most recently prescribed STRIBILD but only took this for two months and then stopped it. He comes to clinic today acutely due to severe rectal pain as well as difficulty with pain with  Initiating voiding as well as pain at the end of his urination.  He admits to unprotected receptive anal intercourse approximately 3 weeks ago with another man who he believes to also have HIV infection. No condoms were used. He did not engage in certain course at that time. He had rectal pain as well as fevers in the last few days.  Reviewed various antiretroviral regimens and I have recommended changing the patient to [facility] and truvada, given this drugs HIGHER barrier to resistance and his hx of poor compliance with ARV.  I performed rectal swab for gonococcus and Chlamydia. Also obtained a specimen for anal Pap smear.  I spent greater than 45 minutes with the patient including greater than 50% of time in face to face counsel of the patient and in coordination of their care.    Review of Systems  Constitutional: Positive for fever. Negative for chills, diaphoresis, activity change, appetite change, fatigue and unexpected weight change.  HENT: Negative for congestion, sore throat, rhinorrhea, sneezing, trouble swallowing and sinus pressure.   Eyes: Negative for photophobia and visual disturbance.  Respiratory: Negative for cough, chest tightness, shortness of breath, wheezing and stridor.   Cardiovascular: Negative for chest pain, palpitations and leg swelling.  Gastrointestinal: Positive for rectal pain. Negative for nausea, vomiting, abdominal pain, diarrhea,  constipation, blood in stool, abdominal distention and anal bleeding.  Genitourinary: Positive for dysuria, urgency, decreased urine volume and penile pain. Negative for hematuria, flank pain and difficulty urinating.  Musculoskeletal: Negative for myalgias, back pain, joint swelling, arthralgias and gait problem.  Skin: Negative for color change, pallor, rash and wound.  Neurological: Negative for dizziness, tremors, weakness and light-headedness.  Hematological: Negative for adenopathy. Does not bruise/bleed easily.  Psychiatric/Behavioral: Negative for behavioral problems, confusion, sleep disturbance, dysphoric mood, decreased concentration and agitation.       Objective:   Physical Exam  Constitutional: He is oriented to person, place, and time. He appears well-developed and well-nourished. No distress.  HENT:  Head: Normocephalic and atraumatic.  Mouth/Throat: Oropharynx is clear and moist. No oropharyngeal exudate.  Eyes: Conjunctivae and EOM are normal.  Neck: Normal range of motion. Neck supple.  Cardiovascular: Normal rate, regular rhythm and normal heart sounds.  Exam reveals no gallop and no friction rub.   No murmur heard. Pulmonary/Chest: Effort normal and breath sounds normal. No respiratory distress. He has no wheezes.  Abdominal: He exhibits no distension. There is no tenderness. There is no rebound.  Genitourinary: Rectal exam shows fissure and tenderness.    Prostate is not enlarged and not tender. Circumcised.     Musculoskeletal: He exhibits no edema and no tenderness.  Neurological: He is alert and oriented to person, place, and time. He exhibits normal muscle tone. Coordination normal.  Skin: Skin is warm and dry. He is not diaphoretic. No erythema. No pallor.  Psychiatric: He has a normal mood and affect. His behavior is normal. Judgment and thought content normal.  Assessment & Plan:  HIV: Change to spell TIVICAY and truvada, check labs this  week via [employer] (he is an employee there) and in one month  Anal pain: Concern is for possible gonorrhea coccal versus chlamydial infection. -- I obtained a gonococcal and chlamydial amplification probe from his rectum. - I'm also checking a GC and Chlamydia from his urine. I'm sending an anal Pap smear. -- We are giving him a dose of ceftriaxone 200 mg intramuscularly 1 g of azithromycin --I've also written for him a 21 day course of doxycycline. --Will also provide him with lidocaine topically. --Recommended that he use a bland diet, to hopefully alleviate any possible constipation that could exacerbate a possible fissure.  Possible anal fissure: This does not resolve with conservative management we'll refer to Gen. Surgery.  Dysuria: Concern for possible prostate infection versus bladder infection GC and Chlamydia samples were then obtained and urine as well as a culture.  High-risk sexual behavior: Of asked him to use condoms with partners  in particular I stressed the importance of this when his VL or partners VL is HIGH!

## 2013-02-11 NOTE — Patient Instructions (Addendum)
We will treat you today  We will get labs here and you can get other labs at [facility] today  You will also need repeat labs from [facility] in another month  You need to followup with Dr. Luciana Axe in next month

## 2013-02-12 LAB — URINALYSIS, ROUTINE W REFLEX MICROSCOPIC
Ketones, ur: 15 mg/dL — AB
Protein, ur: NEGATIVE mg/dL
Urobilinogen, UA: 1 mg/dL (ref 0.0–1.0)

## 2013-02-12 LAB — GC/CHLAMYDIA PROBE AMP: GC Probe RNA: NEGATIVE

## 2013-02-12 NOTE — Addendum Note (Signed)
Addended by: Starleen Arms D on: 02/12/2013 10:40 AM   Modules accepted: Orders

## 2013-02-13 LAB — URINE CULTURE: Colony Count: NO GROWTH

## 2013-02-15 ENCOUNTER — Encounter: Payer: Self-pay | Admitting: Licensed Clinical Social Worker

## 2013-02-18 ENCOUNTER — Other Ambulatory Visit: Payer: Self-pay | Admitting: *Deleted

## 2013-02-18 DIAGNOSIS — B20 Human immunodeficiency virus [HIV] disease: Secondary | ICD-10-CM

## 2013-02-18 MED ORDER — EMTRICITABINE-TENOFOVIR DF 200-300 MG PO TABS: 1.0000 | ORAL_TABLET | Freq: Every day | ORAL | Status: DC

## 2013-02-18 MED ORDER — DOLUTEGRAVIR SODIUM 50 MG PO TABS: 50.0000 mg | ORAL_TABLET | Freq: Every day | ORAL | Status: DC

## 2013-02-18 NOTE — Telephone Encounter (Signed)
Patient called and advised he needs to have his Rx sent to [facility] per his insurance.

## 2013-02-26 ENCOUNTER — Telehealth: Payer: Self-pay | Admitting: Internal Medicine

## 2013-02-26 NOTE — Telephone Encounter (Signed)
Received [facility] lab results - CD4 382, Viral load 50,320.  Integrase inhibitor and genotype both wild type, no mutations.  Patient now on Tivicay and Truvada.

## 2013-03-10 ENCOUNTER — Telehealth: Payer: Self-pay | Admitting: *Deleted

## 2013-03-10 NOTE — Telephone Encounter (Signed)
Patient called for lab results, which appear negative, but unsure if an anal pap was done or probe. Please advise Cole Thomas

## 2013-03-17 NOTE — Telephone Encounter (Signed)
Anal pap shows NO malignant cells

## 2013-03-22 NOTE — Telephone Encounter (Signed)
Patient notified Cole Thomas  

## 2013-03-31 ENCOUNTER — Ambulatory Visit: Payer: Managed Care, Other (non HMO) | Admitting: Infectious Disease

## 2013-04-06 ENCOUNTER — Other Ambulatory Visit: Payer: Self-pay | Admitting: *Deleted

## 2013-04-06 ENCOUNTER — Other Ambulatory Visit: Payer: Self-pay | Admitting: Internal Medicine

## 2013-04-06 ENCOUNTER — Telehealth: Payer: Self-pay

## 2013-04-06 DIAGNOSIS — B029 Zoster without complications: Secondary | ICD-10-CM

## 2013-04-06 MED ORDER — VALACYCLOVIR HCL 1 G PO TABS: 1000.0000 mg | ORAL_TABLET | Freq: Two times a day (BID) | ORAL | Status: DC

## 2013-04-06 NOTE — Telephone Encounter (Signed)
I put it in but not sure of what pharmacy.  His current one is in [place] by mail.  Remind him of his appt next week.  Thanks

## 2013-04-06 NOTE — Telephone Encounter (Signed)
Called patient back and left voice mail for him to call the clinic to schedule an appt to be evaluated. Cole Thomas

## 2013-04-06 NOTE — Telephone Encounter (Signed)
Patient states has a sore on upper lip.  Mitton advised him to use Abreva cream.  It hasn't helped.  Can we call in Rx for him to [facility] on [address] in [place]?

## 2013-04-07 ENCOUNTER — Other Ambulatory Visit: Payer: Self-pay | Admitting: Internal Medicine

## 2013-04-07 DIAGNOSIS — B029 Zoster without complications: Secondary | ICD-10-CM

## 2013-04-07 MED ORDER — VALACYCLOVIR HCL 1 G PO TABS: 1000.0000 mg | ORAL_TABLET | Freq: Two times a day (BID) | ORAL | Status: DC

## 2013-04-12 ENCOUNTER — Ambulatory Visit: Payer: Managed Care, Other (non HMO) | Admitting: Infectious Disease

## 2013-04-26 ENCOUNTER — Ambulatory Visit: Payer: Managed Care, Other (non HMO) | Admitting: Internal Medicine

## 2013-05-18 ENCOUNTER — Ambulatory Visit: Payer: Managed Care, Other (non HMO) | Admitting: Internal Medicine

## 2013-05-18 ENCOUNTER — Telehealth: Payer: Self-pay | Admitting: *Deleted

## 2013-05-18 NOTE — Telephone Encounter (Signed)
Pt thought his appointment was 6/25, not 6/24. Patient off all meds - never called for delivery of his new regimen, "just never thought to do it."  Pt encouraged to call today for medication delivery, should receive them by the end of the week.  Pt requests to do lab work at [employer].  Will leave a rx for his labs at the front desk for him to pick up.  Labs should be done around 06/10/13.  Pt rescheduled for f/u with Dr. Luciana Axe 06/24/13.  Pt verbalized agreement with this plan. Andree Coss, RN

## 2013-06-24 ENCOUNTER — Ambulatory Visit: Payer: Managed Care, Other (non HMO) | Admitting: Internal Medicine

## 2013-07-01 ENCOUNTER — Ambulatory Visit: Payer: Managed Care, Other (non HMO) | Admitting: Internal Medicine

## 2013-08-05 ENCOUNTER — Ambulatory Visit: Payer: Managed Care, Other (non HMO) | Admitting: Internal Medicine

## 2013-10-05 ENCOUNTER — Ambulatory Visit (INDEPENDENT_AMBULATORY_CARE_PROVIDER_SITE_OTHER): Payer: Managed Care, Other (non HMO) | Admitting: Internal Medicine

## 2013-10-05 ENCOUNTER — Encounter: Payer: Self-pay | Admitting: Internal Medicine

## 2013-10-05 ENCOUNTER — Encounter (INDEPENDENT_AMBULATORY_CARE_PROVIDER_SITE_OTHER): Payer: Self-pay

## 2013-10-05 VITALS — BP 131/86 | HR 80 | Temp 97.9°F | Ht 67.0 in | Wt 158.0 lb

## 2013-10-05 DIAGNOSIS — Z23 Encounter for immunization: Secondary | ICD-10-CM

## 2013-10-05 DIAGNOSIS — B20 Human immunodeficiency virus [HIV] disease: Secondary | ICD-10-CM

## 2013-10-05 NOTE — Progress Notes (Signed)
  Subjective:    Patient ID: Cole Thomas, male    DOB: 12-Dec-1980, 32 y.o.   MRN: [id number]  HPI  he is here for followup of HIV. He was diagnosed in 2012 and initially was on Atripla and did not tolerate this and was started on Stribild however he's stopped on his own because he was not convinced of starting therapy. He has had sporadic followup and was last seen in March of this year by my partner and was advised to start tivicay and Truvada. He however continued to have his doubts about starting medication and did not feel he was ready. His CD4 count at that time was 382 with a viral load of 50,000. He gets his labs through [employer] are he works. He comes in for follow up in has questions regarding HIV and treatment and is still unsure if he is ready for treatment. He has had no opportunistic infections, no weight loss or new issues. He feels well and because of this feels he does not need medication.   Review of Systems  Constitutional: Negative for fever, appetite change, fatigue and unexpected weight change.  HENT: Negative for trouble swallowing.   Eyes: Negative for visual disturbance.  Respiratory: Negative for shortness of breath.   Cardiovascular: Negative for leg swelling.  Gastrointestinal: Negative for nausea, abdominal pain and diarrhea.  Musculoskeletal: Negative for back pain.  Skin: Negative for rash.  Neurological: Negative for dizziness, light-headedness and headaches.  Hematological: Negative for adenopathy.  Psychiatric/Behavioral: Negative for dysphoric mood.       Objective:   Physical Exam  Constitutional: He is oriented to person, place, and time. He appears well-developed and well-nourished. No distress.  HENT:  Mouth/Throat: No oropharyngeal exudate.  Eyes: Right eye exhibits no discharge. Left eye exhibits no discharge. No scleral icterus.  Cardiovascular: Normal rate, regular rhythm and normal heart sounds.   No murmur heard. Pulmonary/Chest: Effort normal  and breath sounds normal. No respiratory distress. He has no wheezes.  Lymphadenopathy:    He has no cervical adenopathy.  Neurological: He is alert and oriented to person, place, and time.  Skin: Skin is warm and dry. No rash noted.  Psychiatric: He has a normal mood and affect. His behavior is normal.          Assessment & Plan:

## 2013-10-05 NOTE — Assessment & Plan Note (Addendum)
I discussed at length with the patient regarding the need for treatment. I discussed risks of cancer, HIVAN, heart problems, plus the concern with opportunistic infections once his CD4 count dropped. He voices understanding. He is not interested in starting therapy at this time. Total time of appt of 40 mintues with 20 minutes spent with counseling  We'll get labs and have him return in 2 weeks as he contemplates therapy

## 2013-10-19 ENCOUNTER — Ambulatory Visit: Payer: Managed Care, Other (non HMO) | Admitting: Internal Medicine

## 2013-10-19 ENCOUNTER — Telehealth: Payer: Self-pay | Admitting: *Deleted

## 2013-10-19 NOTE — Telephone Encounter (Signed)
Called patient and left voice mail to call and reschedule his appt, he no showed today. Patient has had multiple cancellations. Cole Thomas

## 2013-11-08 ENCOUNTER — Encounter: Payer: Self-pay | Admitting: Internal Medicine

## 2013-11-23 ENCOUNTER — Telehealth: Payer: Self-pay | Admitting: *Deleted

## 2013-11-23 NOTE — Telephone Encounter (Signed)
RN advised the pt that there are no physicians in the office this week due to the holiday.  Made the pt an appt w/ Dr. Luciana Axe for Friday, Jan. 9 @ 0900.  Advised pt to go to Urgent Care if lesion begins to bother him more.  Pt is currently not on any HIV medications and last VL was 50,000.  Pt has lab work drawn at [facility].  Will need a written lab order to have blood drawn.  He has an appt with K. Shore on 11/30/13, not really enough time to obtain lab results prior to appt w/ Dr. Luciana Axe on 12/03/13.  Pt agreed to call and cancel 12/03/13 if oral lesion improves.

## 2013-11-24 NOTE — Telephone Encounter (Signed)
He has had recent labs already so doesn't need any.  thanks

## 2013-11-26 ENCOUNTER — Telehealth: Payer: Self-pay | Admitting: *Deleted

## 2013-11-26 NOTE — Telephone Encounter (Signed)
Message left that he will need to be seen by a Clinic MD

## 2013-11-30 ENCOUNTER — Ambulatory Visit: Payer: Managed Care, Other (non HMO)

## 2013-12-03 ENCOUNTER — Ambulatory Visit: Payer: Managed Care, Other (non HMO) | Admitting: Internal Medicine

## 2013-12-28 ENCOUNTER — Ambulatory Visit: Payer: Managed Care, Other (non HMO)

## 2014-03-16 ENCOUNTER — Telehealth: Payer: Self-pay | Admitting: *Deleted

## 2014-03-16 ENCOUNTER — Ambulatory Visit (INDEPENDENT_AMBULATORY_CARE_PROVIDER_SITE_OTHER): Payer: Managed Care, Other (non HMO) | Admitting: Internal Medicine

## 2014-03-16 ENCOUNTER — Encounter: Payer: Self-pay | Admitting: Internal Medicine

## 2014-03-16 VITALS — BP 132/81 | HR 76 | Temp 97.7°F | Ht 67.0 in | Wt 155.5 lb

## 2014-03-16 DIAGNOSIS — B20 Human immunodeficiency virus [HIV] disease: Secondary | ICD-10-CM

## 2014-03-16 DIAGNOSIS — Z79899 Other long term (current) drug therapy: Secondary | ICD-10-CM

## 2014-03-16 DIAGNOSIS — Z113 Encounter for screening for infections with a predominantly sexual mode of transmission: Secondary | ICD-10-CM | POA: Insufficient documentation

## 2014-03-16 DIAGNOSIS — R21 Rash and other nonspecific skin eruption: Secondary | ICD-10-CM

## 2014-03-16 MED ORDER — HYDROCORTISONE 1 % EX OINT: 1.0000 "application " | TOPICAL_OINTMENT | Freq: Two times a day (BID) | CUTANEOUS | Status: DC

## 2014-03-16 NOTE — Assessment & Plan Note (Signed)
Labs today. Come back in one week to discuss treatment if he finally agrees.

## 2014-03-16 NOTE — Telephone Encounter (Signed)
Patient left message reporting on-going fatigue as well as a rash around his rectum.  RN called back, left message that Dr. Luciana Axeomer would see the patient this afternoon.  Asked patient to call back and let us know if he was coming. Andree CossMichelle M Setareh Rom, RN

## 2014-03-16 NOTE — Assessment & Plan Note (Signed)
Just looks like irritation, possible allergy.  ? To latex.  Will try steroid cream.

## 2014-03-16 NOTE — Progress Notes (Signed)
  Subjective:    Patient ID: Cole Thomas, male    DOB: 05/16/1981, 33 y.o.   MRN: [id number]  HPI   he is here for a work in visit for a rash around rectum and HIV. He was diagnosed in 2012 and initially was on Atripla and did not tolerate this and was started on Stribild however he's stopped on his own because he was not convinced of starting therapy. He has had sporadic followup and was last seen in December and remained off of meds. He continued to have his doubts about starting medication and did not feel he was ready. His CD4 count at that time was 382 with a viral load of 50,000. no weight loss.  Gets [facility] from [facility]     Review of Systems  Constitutional: Negative for fever, appetite change, fatigue and unexpected weight change.  HENT: Negative for trouble swallowing.   Eyes: Negative for visual disturbance.  Respiratory: Negative for shortness of breath.   Cardiovascular: Negative for leg swelling.  Gastrointestinal: Negative for nausea, abdominal pain and diarrhea.  Musculoskeletal: Negative for back pain.  Skin: Negative for rash.  Neurological: Negative for dizziness, light-headedness and headaches.  Hematological: Negative for adenopathy.  Psychiatric/Behavioral: Negative for dysphoric mood.       Objective:   Physical Exam  Constitutional: He is oriented to person, place, and time. He appears well-developed and well-nourished. No distress.  HENT:  Mouth/Throat: No oropharyngeal exudate.  Eyes: Right eye exhibits no discharge. Left eye exhibits no discharge. No scleral icterus.  Cardiovascular: Normal rate, regular rhythm and normal heart sounds.   No murmur heard. Pulmonary/Chest: Effort normal and breath sounds normal. No respiratory distress. He has no wheezes.  Lymphadenopathy:    He has no cervical adenopathy.  Neurological: He is alert and oriented to person, place, and time.  Skin: Skin is warm and dry. No rash noted.  Psychiatric: He has a normal mood and  affect. His behavior is normal.          Assessment & Plan:

## 2014-03-24 ENCOUNTER — Ambulatory Visit: Payer: Managed Care, Other (non HMO) | Admitting: Internal Medicine

## 2014-05-09 ENCOUNTER — Encounter: Payer: Self-pay | Admitting: Internal Medicine

## 2014-06-30 ENCOUNTER — Ambulatory Visit: Payer: Managed Care, Other (non HMO) | Admitting: Internal Medicine

## 2014-07-14 ENCOUNTER — Encounter: Payer: Self-pay | Admitting: Internal Medicine

## 2014-07-14 ENCOUNTER — Ambulatory Visit (INDEPENDENT_AMBULATORY_CARE_PROVIDER_SITE_OTHER): Payer: Managed Care, Other (non HMO) | Admitting: Internal Medicine

## 2014-07-14 VITALS — BP 144/83 | HR 88 | Temp 98.4°F | Wt 156.0 lb

## 2014-07-14 DIAGNOSIS — B20 Human immunodeficiency virus [HIV] disease: Secondary | ICD-10-CM

## 2014-07-14 DIAGNOSIS — Z23 Encounter for immunization: Secondary | ICD-10-CM

## 2014-07-14 MED ORDER — ELVITEG-COBIC-EMTRICIT-TENOFDF 150-150-200-300 MG PO TABS: 1.0000 | ORAL_TABLET | Freq: Every day | ORAL | Status: DC

## 2014-07-14 NOTE — Assessment & Plan Note (Signed)
Says he is now interested in starting treatment.  Has been coming to clinic 8 years and has not wanted to take until now.  I discussed regimen and he has opted for Stribild.  He will let us know his specialty pharmacy and we will send Stribild to that pharmacy.  He will then get labs 4 weeks after starting through [facility].  Prescription for labs provided.

## 2014-07-14 NOTE — Progress Notes (Signed)
  Subjective:    Patient ID: Cole Thomas, male    DOB: 05/04/1981, 33 y.o.   MRN: [id number]  HPI  He is here for follow up of HIV. He was diagnosed in 2012 and initially was on Atripla and did not tolerate this and was started on Stribild however he's stopped on his own because he was not convinced of starting therapy. He has had sporadic followup and was last seen in December and remained off of meds. He continued to have his doubts about starting medication and did not feel he was ready. His CD4 count at that time was 382 with a viral load of 50,000. no weight loss.  Gets labs from [facility]  Interested in starting now.  Biggest hurdle is getting to clinic.     Review of Systems  Constitutional: Negative for fever, appetite change, fatigue and unexpected weight change.  HENT: Negative for trouble swallowing.   Eyes: Negative for visual disturbance.  Respiratory: Negative for shortness of breath.   Cardiovascular: Negative for leg swelling.  Gastrointestinal: Negative for nausea, abdominal pain and diarrhea.  Musculoskeletal: Negative for back pain.  Skin: Negative for rash.  Neurological: Negative for dizziness, light-headedness and headaches.  Hematological: Negative for adenopathy.  Psychiatric/Behavioral: Negative for dysphoric mood.       Objective:   Physical Exam  Constitutional: He is oriented to person, place, and time. He appears well-developed and well-nourished. No distress.  HENT:  Mouth/Throat: No oropharyngeal exudate.  Eyes: Right eye exhibits no discharge. Left eye exhibits no discharge. No scleral icterus.  Cardiovascular: Normal rate, regular rhythm and normal heart sounds.   No murmur heard. Pulmonary/Chest: Effort normal and breath sounds normal. No respiratory distress. He has no wheezes.  Lymphadenopathy:    He has no cervical adenopathy.  Neurological: He is alert and oriented to person, place, and time.  Skin: Skin is warm and dry. No rash noted.   Psychiatric: He has a normal mood and affect. His behavior is normal.          Assessment & Plan:

## 2014-08-22 ENCOUNTER — Other Ambulatory Visit: Payer: Self-pay | Admitting: *Deleted

## 2014-08-22 DIAGNOSIS — B20 Human immunodeficiency virus [HIV] disease: Secondary | ICD-10-CM

## 2014-08-22 MED ORDER — ELVITEG-COBIC-EMTRICIT-TENOFDF 150-150-200-300 MG PO TABS: 1.0000 | ORAL_TABLET | Freq: Every day | ORAL | Status: DC

## 2014-08-22 NOTE — Telephone Encounter (Signed)
ADAP Application,

## 2014-09-15 ENCOUNTER — Other Ambulatory Visit (INDEPENDENT_AMBULATORY_CARE_PROVIDER_SITE_OTHER): Payer: Self-pay

## 2014-09-15 DIAGNOSIS — Z79899 Other long term (current) drug therapy: Secondary | ICD-10-CM

## 2014-09-15 DIAGNOSIS — K6289 Other specified diseases of anus and rectum: Secondary | ICD-10-CM

## 2014-09-15 DIAGNOSIS — B20 Human immunodeficiency virus [HIV] disease: Secondary | ICD-10-CM

## 2014-09-15 DIAGNOSIS — Z113 Encounter for screening for infections with a predominantly sexual mode of transmission: Secondary | ICD-10-CM

## 2014-09-15 LAB — COMPREHENSIVE METABOLIC PANEL
ALK PHOS: 66 U/L (ref 39–117)
ALT: 29 U/L (ref 0–53)
AST: 24 U/L (ref 0–37)
Albumin: 4.1 g/dL (ref 3.5–5.2)
BUN: 14 mg/dL (ref 6–23)
CO2: 28 mEq/L (ref 19–32)
CREATININE: 0.88 mg/dL (ref 0.50–1.35)
Calcium: 8.9 mg/dL (ref 8.4–10.5)
Chloride: 105 mEq/L (ref 96–112)
Glucose, Bld: 68 mg/dL — ABNORMAL LOW (ref 70–99)
Potassium: 4.2 mEq/L (ref 3.5–5.3)
Sodium: 139 mEq/L (ref 135–145)
Total Bilirubin: 0.4 mg/dL (ref 0.2–1.2)
Total Protein: 7.6 g/dL (ref 6.0–8.3)

## 2014-09-15 LAB — LIPID PANEL
Cholesterol: 166 mg/dL (ref 0–200)
HDL: 69 mg/dL (ref >39)
LDL CALC: 86 mg/dL (ref 0–99)
Total CHOL/HDL Ratio: 2.4 Ratio
Triglycerides: 57 mg/dL (ref <150)
VLDL: 11 mg/dL (ref 0–40)

## 2014-09-15 NOTE — Addendum Note (Signed)
Addended bySteva Colder: Lilyann Gravelle on: 09/15/2014 04:54 PM   Modules accepted: Orders

## 2014-09-16 LAB — CBC WITH DIFFERENTIAL/PLATELET
BASOS ABS: 0 10*3/uL (ref 0.0–0.1)
Basophils Relative: 0 % (ref 0–1)
EOS ABS: 0 10*3/uL (ref 0.0–0.7)
Eosinophils Relative: 1 % (ref 0–5)
HCT: 38.8 % — ABNORMAL LOW (ref 39.0–52.0)
Hemoglobin: 13.4 g/dL (ref 13.0–17.0)
Lymphocytes Relative: 62 % — ABNORMAL HIGH (ref 12–46)
Lymphs Abs: 2.4 10*3/uL (ref 0.7–4.0)
MCH: 28.3 pg (ref 26.0–34.0)
MCHC: 34.5 g/dL (ref 30.0–36.0)
MCV: 82 fL (ref 78.0–100.0)
Monocytes Absolute: 0.5 10*3/uL (ref 0.1–1.0)
Monocytes Relative: 13 % — ABNORMAL HIGH (ref 3–12)
NEUTROS PCT: 24 % — AB (ref 43–77)
Neutro Abs: 0.9 10*3/uL — ABNORMAL LOW (ref 1.7–7.7)
PLATELETS: 187 10*3/uL (ref 150–400)
RBC: 4.73 MIL/uL (ref 4.22–5.81)
RDW: 13.6 % (ref 11.5–15.5)
WBC: 3.8 10*3/uL — ABNORMAL LOW (ref 4.0–10.5)

## 2014-09-16 LAB — T-HELPER CELL (CD4) - (RCID CLINIC ONLY)
CD4 % Helper T Cell: 17 % — ABNORMAL LOW (ref 33–55)
CD4 T CELL ABS: 380 /uL — AB (ref 400–2700)

## 2014-09-16 LAB — PATHOLOGIST SMEAR REVIEW

## 2014-09-16 LAB — RPR

## 2014-09-18 LAB — HIV-1 RNA QUANT-NO REFLEX-BLD
HIV 1 RNA QUANT: 24498 {copies}/mL — AB (ref <20)
HIV-1 RNA Quant, Log: 4.39 {Log} — ABNORMAL HIGH (ref <1.30)

## 2014-09-19 LAB — URINE CYTOLOGY ANCILLARY ONLY
Chlamydia: NEGATIVE
Neisseria Gonorrhea: NEGATIVE

## 2014-09-21 LAB — HLA B*5701: HLA-B*5701 w/rflx HLA-B High: NEGATIVE

## 2014-10-25 ENCOUNTER — Other Ambulatory Visit: Payer: Self-pay | Admitting: *Deleted

## 2014-10-25 DIAGNOSIS — B20 Human immunodeficiency virus [HIV] disease: Secondary | ICD-10-CM

## 2014-10-25 MED ORDER — ELVITEG-COBIC-EMTRICIT-TENOFDF 150-150-200-300 MG PO TABS: 1.0000 | ORAL_TABLET | Freq: Every day | ORAL | Status: DC

## 2014-10-25 NOTE — Telephone Encounter (Signed)
Patient called his ADAP is approved and he needs Stribild called to pharmacy.    Laurell Josephsammy K King, RN

## 2014-10-26 ENCOUNTER — Other Ambulatory Visit: Payer: Self-pay

## 2014-10-26 DIAGNOSIS — B20 Human immunodeficiency virus [HIV] disease: Secondary | ICD-10-CM

## 2014-10-26 MED ORDER — ELVITEG-COBIC-EMTRICIT-TENOFDF 150-150-200-300 MG PO TABS: 1.0000 | ORAL_TABLET | Freq: Every day | ORAL | Status: DC

## 2014-11-02 ENCOUNTER — Telehealth: Payer: Self-pay | Admitting: *Deleted

## 2014-11-02 ENCOUNTER — Other Ambulatory Visit: Payer: Self-pay | Admitting: Internal Medicine

## 2014-11-02 MED ORDER — ONDANSETRON HCL 4 MG PO TABS: 4.0000 mg | ORAL_TABLET | Freq: Three times a day (TID) | ORAL | Status: DC | PRN

## 2014-11-02 NOTE — Telephone Encounter (Signed)
zofran sent to his pharmacy. thanks

## 2014-11-02 NOTE — Telephone Encounter (Signed)
Patient called and advised he has had sever nausea since starting his medication. He advised he was told to give it a couple of weeks and it should get better. He advised it has gotten worse and he wants to be given something for the nausea. Advised will ask the doctor and call him back if he will prescribe something.

## 2015-01-03 ENCOUNTER — Encounter: Payer: Self-pay | Admitting: Infectious Diseases

## 2015-01-03 ENCOUNTER — Ambulatory Visit (INDEPENDENT_AMBULATORY_CARE_PROVIDER_SITE_OTHER): Payer: Self-pay | Admitting: Infectious Diseases

## 2015-01-03 VITALS — BP 105/75 | HR 94 | Temp 98.5°F | Wt 172.0 lb

## 2015-01-03 DIAGNOSIS — Z113 Encounter for screening for infections with a predominantly sexual mode of transmission: Secondary | ICD-10-CM

## 2015-01-03 DIAGNOSIS — B20 Human immunodeficiency virus [HIV] disease: Secondary | ICD-10-CM

## 2015-01-03 DIAGNOSIS — A630 Anogenital (venereal) warts: Secondary | ICD-10-CM

## 2015-01-03 DIAGNOSIS — J029 Acute pharyngitis, unspecified: Secondary | ICD-10-CM | POA: Insufficient documentation

## 2015-01-03 DIAGNOSIS — Z79899 Other long term (current) drug therapy: Secondary | ICD-10-CM

## 2015-01-03 LAB — LIPID PANEL
Cholesterol: 164 mg/dL (ref 0–200)
HDL: 66 mg/dL (ref >39)
LDL Cholesterol: 86 mg/dL (ref 0–99)
Total CHOL/HDL Ratio: 2.5 Ratio
Triglycerides: 61 mg/dL (ref <150)
VLDL: 12 mg/dL (ref 0–40)

## 2015-01-03 LAB — COMPREHENSIVE METABOLIC PANEL
ALT: 36 U/L (ref 0–53)
AST: 29 U/L (ref 0–37)
Albumin: 4.1 g/dL (ref 3.5–5.2)
Alkaline Phosphatase: 67 U/L (ref 39–117)
BUN: 9 mg/dL (ref 6–23)
CO2: 29 meq/L (ref 19–32)
Calcium: 9.5 mg/dL (ref 8.4–10.5)
Chloride: 103 mEq/L (ref 96–112)
Creat: 0.86 mg/dL (ref 0.50–1.35)
Glucose, Bld: 81 mg/dL (ref 70–99)
POTASSIUM: 4.3 meq/L (ref 3.5–5.3)
Sodium: 138 mEq/L (ref 135–145)
Total Bilirubin: 0.5 mg/dL (ref 0.2–1.2)
Total Protein: 8.1 g/dL (ref 6.0–8.3)

## 2015-01-03 LAB — CBC
HCT: 42.7 % (ref 39.0–52.0)
Hemoglobin: 14.8 g/dL (ref 13.0–17.0)
MCH: 28.8 pg (ref 26.0–34.0)
MCHC: 34.7 g/dL (ref 30.0–36.0)
MCV: 83.1 fL (ref 78.0–100.0)
MPV: 9.4 fL (ref 8.6–12.4)
PLATELETS: 208 10*3/uL (ref 150–400)
RBC: 5.14 MIL/uL (ref 4.22–5.81)
RDW: 13.8 % (ref 11.5–15.5)
WBC: 4.7 10*3/uL (ref 4.0–10.5)

## 2015-01-03 NOTE — Progress Notes (Signed)
   Subjective:    Patient ID: Cole Thomas, male    DOB: 03/31/1981, 34 y.o.   MRN: [id number]  HPI 34 yo M with hx of HIV+ since 2012 and previously on atripla. He has taken ART sparingly until he returned 06-2014 and wanted to be started on ART. He was started on stribild. He has since stopped taking this as he did not have more energy and did not feel better.  Today complains of night sweats but no fevers. Has had to change his shirt but not his sheets. Has had a sore throat as well. No thrush.   HIV 1 RNA QUANT (copies/mL)  Date Value  09/15/2014 24498*  07/22/2012 30484*  12/12/2011 8372*   HIV-1 RNA VIRAL LOAD (no units)  Date Value  02/11/2013 50000   CD4 T CELL ABS  Date Value  09/15/2014 380 /uL*  02/11/2013 382  07/22/2012 420 cmm   Review of Systems  Constitutional: Negative for fever, chills, appetite change and unexpected weight change.  HENT: Positive for rhinorrhea and sore throat. Negative for postnasal drip.   Gastrointestinal: Negative for diarrhea and constipation.  Genitourinary: Negative for difficulty urinating.  no snoring.      Objective:   Physical Exam  Constitutional: He appears well-developed and well-nourished.  HENT:  Mouth/Throat: No oropharyngeal exudate.  Eyes: EOM are normal. Pupils are equal, round, and reactive to light.  Neck: Neck supple.  Cardiovascular: Normal rate, regular rhythm and normal heart sounds.   Pulmonary/Chest: Effort normal and breath sounds normal.  Abdominal: Soft. Bowel sounds are normal. He exhibits no distension. There is no tenderness.  Lymphadenopathy:    He has no cervical adenopathy.  Skin: No rash noted.          Assessment & Plan:

## 2015-01-03 NOTE — Assessment & Plan Note (Signed)
States these are currently not active. Offered pt anoscopy.

## 2015-01-03 NOTE — Assessment & Plan Note (Signed)
Advised him to take OTC antihistamines.

## 2015-01-03 NOTE — Assessment & Plan Note (Signed)
Will recheck his labs today. Wants STI testing as well. Is given condoms.  Encouraged him to take ART and take it REGULARLY.  He has gotten flu shot.  rtc 6 weeks.

## 2015-01-04 LAB — RPR

## 2015-01-05 LAB — HIV-1 RNA ULTRAQUANT REFLEX TO GENTYP+
HIV 1 RNA Quant: 10038 copies/mL — ABNORMAL HIGH (ref <20)
HIV-1 RNA QUANT, LOG: 4 {Log} — AB (ref <1.30)

## 2015-01-06 LAB — T-HELPER CELL (CD4) - (RCID CLINIC ONLY)
CD4 % Helper T Cell: 19 % — ABNORMAL LOW (ref 33–55)
CD4 T Cell Abs: 260 /uL — ABNORMAL LOW (ref 400–2700)

## 2015-01-11 LAB — HIV-1 GENOTYPR PLUS

## 2015-01-11 LAB — HIV-1 INTEGRASE GENOTYPE

## 2015-01-11 LAB — HLA B*5701: HLA-B 5701 W/RFLX HLA-B HIGH: NEGATIVE

## 2015-01-23 ENCOUNTER — Other Ambulatory Visit: Payer: Self-pay | Admitting: *Deleted

## 2015-01-23 DIAGNOSIS — B20 Human immunodeficiency virus [HIV] disease: Secondary | ICD-10-CM

## 2015-01-23 MED ORDER — ELVITEG-COBIC-EMTRICIT-TENOFDF 150-150-200-300 MG PO TABS: 1.0000 | ORAL_TABLET | Freq: Every day | ORAL | Status: DC

## 2015-02-06 ENCOUNTER — Encounter: Payer: Self-pay | Admitting: Infectious Diseases

## 2015-02-06 ENCOUNTER — Ambulatory Visit (INDEPENDENT_AMBULATORY_CARE_PROVIDER_SITE_OTHER): Payer: Self-pay | Admitting: Infectious Diseases

## 2015-02-06 VITALS — BP 117/86 | HR 80 | Temp 98.1°F | Ht 67.0 in | Wt 173.0 lb

## 2015-02-06 DIAGNOSIS — J309 Allergic rhinitis, unspecified: Secondary | ICD-10-CM | POA: Insufficient documentation

## 2015-02-06 DIAGNOSIS — B20 Human immunodeficiency virus [HIV] disease: Secondary | ICD-10-CM

## 2015-02-06 DIAGNOSIS — A630 Anogenital (venereal) warts: Secondary | ICD-10-CM

## 2015-02-06 DIAGNOSIS — J301 Allergic rhinitis due to pollen: Secondary | ICD-10-CM

## 2015-02-06 NOTE — Progress Notes (Signed)
   Subjective:    Patient ID: Cole Thomas, male    DOB: 06/04/1981, 34 y.o.   MRN: [id number]  HPI 34 yo M with hx of HIV+ since 2012 and previously on atripla. He has taken ART sparingly until he returned 06-2014 and wanted to be started on ART. He was started on stribild. He has since stopped taking this as he did not have more energy and did not feel better.  Has some occasional cough but otherwise feels well.  No fatigue or headache. No fevers.  Has been off ART for a "few months".   HIV 1 RNA QUANT (copies/mL)  Date Value  01/03/2015 10038*  09/15/2014 [id number]*  07/22/2012 [id number]*   HIV-1 RNA VIRAL LOAD (no units)  Date Value  02/11/2013 50000   CD4 T CELL ABS  Date Value  01/04/2015 260 /uL*  09/15/2014 380 /uL*  02/11/2013 382   Review of Systems  Constitutional: Negative for fever, chills, appetite change and unexpected weight change.  HENT: Positive for postnasal drip.   Respiratory: Positive for cough. Negative for shortness of breath.   Gastrointestinal: Negative for diarrhea and constipation.  Genitourinary: Negative for difficulty urinating.       Objective:   Physical Exam  Constitutional: He appears well-developed and well-nourished.  HENT:  Mouth/Throat: No oropharyngeal exudate.  Eyes: EOM are normal. Pupils are equal, round, and reactive to light.  Neck: Neck supple.  Cardiovascular: Normal rate, regular rhythm and normal heart sounds.   Pulmonary/Chest: Effort normal and breath sounds normal.  Abdominal: Soft. Bowel sounds are normal. He exhibits no distension. There is no tenderness.  Lymphadenopathy:    He has no cervical adenopathy.       Assessment & Plan:

## 2015-02-06 NOTE — Assessment & Plan Note (Signed)
Not currently active.  

## 2015-02-06 NOTE — Assessment & Plan Note (Signed)
Encouraged him to take art.  He does not want to take.  Warned him about his dropping Cd4.  His vax are up to date.  Offered/refused condoms.

## 2015-02-06 NOTE — Assessment & Plan Note (Signed)
rec him to take claritin or zyrtec.

## 2015-02-09 ENCOUNTER — Telehealth: Payer: Self-pay | Admitting: *Deleted

## 2015-02-09 NOTE — Telephone Encounter (Signed)
Allergy symptoms persisting today- started OTC Claritin last evening.  Advised pt that one dose does not always bring the desired results.  It may take several days for the medication to control the symptoms.  Advised pt that he could use OTC saline nasal spray to help with nasal symptoms from the pollen.  Pt stated that he would continue the OTC Claritin per package directions and begin using OTC saline nasal spray.

## 2015-02-21 ENCOUNTER — Telehealth: Payer: Self-pay | Admitting: Licensed Clinical Social Worker

## 2015-02-21 NOTE — Telephone Encounter (Signed)
Patient called about an appointment for anoscopy with Dr. Ninetta LightsHatcher. I advised the patient that we would schedule him at the next clinic.

## 2015-02-23 NOTE — Telephone Encounter (Signed)
Patient was scheduled for 03/24/15 at 9:00 AM and he is aware of the appointment. Cole Thomas

## 2015-03-24 ENCOUNTER — Ambulatory Visit (INDEPENDENT_AMBULATORY_CARE_PROVIDER_SITE_OTHER): Payer: Self-pay | Admitting: Infectious Diseases

## 2015-03-24 ENCOUNTER — Encounter: Payer: Self-pay | Admitting: Infectious Diseases

## 2015-03-24 VITALS — BP 126/81 | HR 91

## 2015-03-24 DIAGNOSIS — A630 Anogenital (venereal) warts: Secondary | ICD-10-CM

## 2015-03-24 NOTE — Progress Notes (Signed)
Patient ID: Cole Thomas, male   DOB: 07/06/1981, 34 y.o.   MRN: [id number]  34 yo M with hx of HIV+, her efor anoscopy due to hx of condyloma.  Is on no ART, by his choice.   HIV 1 RNA QUANT (copies/mL)  Date Value  01/03/2015 10038*  09/15/2014 24498*  07/22/2012 [id number]*   HIV-1 RNA VIRAL LOAD (no units)  Date Value  02/11/2013 50000   CD4 T CELL ABS  Date Value  01/04/2015 260 /uL*  09/15/2014 380 /uL*  02/11/2013 382    Filed Vitals:   03/24/15 0916  BP: 126/81  Pulse: 91      PRE-OPERATIVE DIAGNOSIS:  Anal condyloma, HRA with bx  POST-OPERATIVE DIAGNOSIS:  Anal condyloma, HRA   PROCEDURE:    SURGEON:  Allysia Ingles  ASSISTANT: [place]  ANESTHESIA:   local  EBL: < 10 cc   SPECIMEN:  No Specimen  DISPOSITION OF SPECIMEN:  N/A  COUNTS:  YES  PLAN OF CARE: home  PATIENT DISPOSITION:  stable, home  INDICATION:  HIV, condyloma  OR FINDINGS:  Normal exam  DESCRIPTION: The patient was identified in the waiting area and taken to the exam room where they were laid on the table in the lateral decubitus position.  The patient was then prepped and draped in the usual fashion. A surgical timeout was performed indicating the correct patient, procedure, positioning and preoperative antibioitics.   After this was completed, a sponge was soaked in 2% acetic acid was placed over the perianal region. This was allowed to soak for 1 minute. The sponge was removed and the perianal region was evaluated with a colposcope.  There were no external lesions.  The internal anal canal was evaluated via anoscopy with an anoscope.  There were no internal lesions.  There was no bleeding.    PLAN:  Routine screening.  rtc as prev scheduled HPV vacine not indicated, pt > [age] yo.  Offered/refused condoms.

## 2015-03-24 NOTE — Assessment & Plan Note (Signed)
Anoscopy 03-24-15. Normal.

## 2015-04-09 ENCOUNTER — Emergency Department (HOSPITAL_COMMUNITY)
Admission: EM | Admit: 2015-04-09 | Discharge: 2015-04-09 | Disposition: A | Discharge status: Home / Self Care | Payer: Self-pay | Attending: Emergency Medicine | Admitting: Emergency Medicine

## 2015-04-09 ENCOUNTER — Encounter (HOSPITAL_COMMUNITY): Payer: Self-pay | Admitting: Emergency Medicine

## 2015-04-09 DIAGNOSIS — Z21 Asymptomatic human immunodeficiency virus [HIV] infection status: Secondary | ICD-10-CM | POA: Insufficient documentation

## 2015-04-09 DIAGNOSIS — M79601 Pain in right arm: Secondary | ICD-10-CM | POA: Insufficient documentation

## 2015-04-09 LAB — D-DIMER, QUANTITATIVE: D-Dimer, Quant: 0.47 ug/mL-FEU (ref 0.00–0.48)

## 2015-04-09 MED ORDER — HYDROCODONE-ACETAMINOPHEN 5-325 MG PO TABS
1.0000 | ORAL_TABLET | Freq: Once | ORAL | Status: AC
  Administered 2015-04-09: 1 via ORAL
  Filled 2015-04-09: qty 1

## 2015-04-09 MED ORDER — IBUPROFEN 400 MG PO TABS
600.0000 mg | ORAL_TABLET | Freq: Once | ORAL | Status: AC
  Administered 2015-04-09: 600 mg via ORAL
  Filled 2015-04-09 (×2): qty 1

## 2015-04-09 MED ORDER — IBUPROFEN 600 MG PO TABS: 600.0000 mg | ORAL_TABLET | Freq: Four times a day (QID) | ORAL | Status: DC | PRN

## 2015-04-09 NOTE — ED Provider Notes (Signed)
CSN: [id number]     Arrival date & time 04/09/15  0417 History   First MD Initiated Contact with Patient 04/09/15 0445     Chief Complaint  Patient presents with  . Arm Pain     (Consider location/radiation/quality/duration/timing/severity/associated sxs/prior Treatment) HPI  This is a 34 year old male with a history of HIV who presents with right arm pain. Reports onset of symptoms when he went to sleep last night. Denies any injury. Reports that he has pain over his right lateral arm. Reports the pain is 8 out of 10. He took one 200 mg ibuprofen with minimal relief. The pain is nonradiating. It is not exacerbated with range of motion of his neck. Reports that he was unable to sleep at night because of the pain. Does not think that he slept on it wrong but the pain does get worse when he lays on his right arm. No swelling or tingling of the hand noted. No history of DVTs.  Past Medical History  Diagnosis Date  . HIV infection    History reviewed. No pertinent past surgical history. No family history on file. History  Substance Use Topics  . Smoking status: Never Smoker   . Smokeless tobacco: Never Used  . Alcohol Use: No    Review of Systems  Constitutional: Negative.  Negative for fever.  Respiratory: Negative.  Negative for chest tightness and shortness of breath.   Cardiovascular: Negative.  Negative for chest pain.  Musculoskeletal: Negative for neck pain.       Right arm pain  Skin: Negative for wound.  Neurological: Negative for weakness and numbness.  All other systems reviewed and are negative.     Allergies  Atripla  Home Medications   Prior to Admission medications   Medication Sig Start Date End Date Taking? Authorizing Provider  elvitegravir-cobicistat-emtricitabine-tenofovir (STRIBILD) 150-150-200-300 MG TABS tablet Take 1 tablet by mouth daily. Take with food Patient not taking: Reported on 02/06/2015 01/23/15   Randall Hissornelius N Van Dam, MD  ibuprofen  (ADVIL,MOTRIN) 600 MG tablet Take 1 tablet (600 mg total) by mouth every 6 (six) hours as needed. 04/09/15   Shon Batonourtney F Soleil Mas, MD  ondansetron (ZOFRAN) 4 MG tablet Take 1 tablet (4 mg total) by mouth every 8 (eight) hours as needed for nausea or vomiting. Patient not taking: Reported on 01/03/2015 11/02/14   Gardiner Barefootobert W Comer, MD   BP 109/69 mmHg  Pulse 72  Temp(Src) 97.9 F (36.6 C) (Oral)  Resp 16  Ht 5\' 7"  (1.702 m)  Wt 170 lb (77.111 kg)  BMI 26.62 kg/m2  SpO2 99% Physical Exam  Constitutional: He is oriented to person, place, and time. He appears well-developed and well-nourished. No distress.  HENT:  Head: Normocephalic and atraumatic.  Cardiovascular: Normal rate and regular rhythm.   Pulmonary/Chest: Effort normal. No respiratory distress.  Musculoskeletal: Normal range of motion. He exhibits no edema.  Normal range of motion of the right shoulder and right elbow, tenderness to palpation over the right lateral humerus, no obvious abrasions or contusions, no obvious deformity, 2+ radial pulses, strength intact distally  Neurological: He is alert and oriented to person, place, and time.  Skin: Skin is warm and dry.  Psychiatric: He has a normal mood and affect.  Nursing note and vitals reviewed.   ED Course  Procedures (including critical care time) Labs Review Labs Reviewed  D-DIMER, QUANTITATIVE    Imaging Review No results found.   EKG Interpretation None      MDM  Final diagnoses:  Pain of right upper extremity    Patient presents with right arm pain. Denies injury. Exam is largely unremarkable he is neurovascularly intact and without evidence of deformity or other injury. One emergent consideration would be DVT. He is low risk. D-dimer sent. Patient given Toradol and Norco. Reports improvement of pain with pain management. D-dimer is negative. Discussed with patient likelihood of occult contusion.  Patient will monitor for the next several days and take ibuprofen  every 6 hours. If his symptoms do not improve, he will follow-up with his primary physician.  After history, exam, and medical workup I feel the patient has been appropriately medically screened and is safe for discharge home. Pertinent diagnoses were discussed with the patient. Patient was given return precautions.     Shon Batonourtney F Myran Arcia, MD 04/09/15 [phone]

## 2015-04-09 NOTE — Discharge Instructions (Signed)
Youo presented with pain to the right arm. Your screening test for blood clots is negative. No obvious injury or deformity. He may have underlying bruising or a pinched nerve causing your pain. Treatment is supportive with anti-inflammatory medications. If your symptoms do not improve, you need to follow-up with her primary care physician.

## 2015-04-09 NOTE — ED Notes (Signed)
Pt verbalized understanding of d/c instructions and has no further questions. Pt discharged home with sister driving.

## 2015-04-09 NOTE — ED Notes (Signed)
Patient with right arm pain, sharp in nature, stabbing.  Patient states that he may have slept on his arm wrong.  Patient states that he has not been to sleep tonight due to the pain.  Patient denies any chest pain or shortness of breath.

## 2015-04-09 NOTE — ED Notes (Signed)
Pt complains of arm pain that began overnight, pt has full rom in right arm and is not tender upon palpation. Pt states that it hurts worse when he lays on that side but not when touched.

## 2015-05-09 ENCOUNTER — Ambulatory Visit (INDEPENDENT_AMBULATORY_CARE_PROVIDER_SITE_OTHER): Payer: Self-pay | Admitting: Internal Medicine

## 2015-05-09 VITALS — BP 121/84 | HR 87 | Temp 98.2°F | Ht 67.0 in | Wt 178.0 lb

## 2015-05-09 DIAGNOSIS — J302 Other seasonal allergic rhinitis: Secondary | ICD-10-CM

## 2015-05-09 DIAGNOSIS — B20 Human immunodeficiency virus [HIV] disease: Secondary | ICD-10-CM

## 2015-05-09 NOTE — Assessment & Plan Note (Addendum)
I again reiterated that it is imperative that he start taking medication for his health. He voiced his understanding and continues to refuse to take medicine. Follow-up in 3 months for his routine visit.

## 2015-05-09 NOTE — Assessment & Plan Note (Signed)
Exam and history seems consistent with allergic rhinitis. I reiterated as he was told by her Ninetta Lights that allergy medicine may help. He will try Benadryl at night or Zyrtec daily.

## 2015-05-09 NOTE — Progress Notes (Signed)
   Subjective:    Patient ID: Cole Thomas, male    DOB: Apr 15, 1981, 34 y.o.   MRN: [id number]  HPI He comes in here for a work in visit. He was previously seen by Dr. Ninetta Lights and had complaints then of some ear discomfort and thought to be allergic rhinitis. He continues to have ear discomfort. He does use Q-tips. No fever or chills. Congestion. No history of allergic rhinitis in the past.   Review of Systems  Constitutional: Negative for appetite change and fatigue.  HENT: Positive for ear pain and sinus pressure. Negative for sore throat.   Gastrointestinal: Negative for diarrhea.       Objective:   Physical Exam  Constitutional: He appears well-developed and well-nourished.  HENT:  Left Ear: External ear normal.  Left ear with fluid behind it otherwise no erythema  Lymphadenopathy:    He has no cervical adenopathy.  Skin: No rash noted.          Assessment & Plan:

## 2015-07-26 ENCOUNTER — Other Ambulatory Visit: Payer: Self-pay

## 2015-07-27 ENCOUNTER — Other Ambulatory Visit: Payer: Self-pay

## 2015-08-09 ENCOUNTER — Ambulatory Visit (INDEPENDENT_AMBULATORY_CARE_PROVIDER_SITE_OTHER): Payer: Self-pay | Admitting: Infectious Diseases

## 2015-08-09 VITALS — BP 121/82 | HR 81 | Temp 98.3°F | Ht 67.0 in | Wt 179.0 lb

## 2015-08-09 DIAGNOSIS — Z113 Encounter for screening for infections with a predominantly sexual mode of transmission: Secondary | ICD-10-CM

## 2015-08-09 DIAGNOSIS — B20 Human immunodeficiency virus [HIV] disease: Secondary | ICD-10-CM

## 2015-08-09 DIAGNOSIS — J302 Other seasonal allergic rhinitis: Secondary | ICD-10-CM

## 2015-08-09 DIAGNOSIS — Z23 Encounter for immunization: Secondary | ICD-10-CM

## 2015-08-09 LAB — CBC
HEMATOCRIT: 41.5 % (ref 39.0–52.0)
Hemoglobin: 14.3 g/dL (ref 13.0–17.0)
MCH: 28.9 pg (ref 26.0–34.0)
MCHC: 34.5 g/dL (ref 30.0–36.0)
MCV: 84 fL (ref 78.0–100.0)
MPV: 9.3 fL (ref 8.6–12.4)
PLATELETS: 209 10*3/uL (ref 150–400)
RBC: 4.94 MIL/uL (ref 4.22–5.81)
RDW: 13.7 % (ref 11.5–15.5)
WBC: 3.5 10*3/uL — ABNORMAL LOW (ref 4.0–10.5)

## 2015-08-09 LAB — COMPREHENSIVE METABOLIC PANEL
ALK PHOS: 60 U/L (ref 40–115)
ALT: 53 U/L — AB (ref 9–46)
AST: 35 U/L (ref 10–40)
Albumin: 4.1 g/dL (ref 3.6–5.1)
BILIRUBIN TOTAL: 0.4 mg/dL (ref 0.2–1.2)
BUN: 12 mg/dL (ref 7–25)
CALCIUM: 9.4 mg/dL (ref 8.6–10.3)
CO2: 28 mmol/L (ref 20–31)
Chloride: 104 mmol/L (ref 98–110)
Creat: 0.92 mg/dL (ref 0.60–1.35)
Glucose, Bld: 73 mg/dL (ref 65–99)
POTASSIUM: 4.4 mmol/L (ref 3.5–5.3)
Sodium: 140 mmol/L (ref 135–146)
TOTAL PROTEIN: 8.3 g/dL — AB (ref 6.1–8.1)

## 2015-08-09 NOTE — Addendum Note (Signed)
Addended by: Andree Coss on: 08/09/2015 01:19 PM   Modules accepted: Orders

## 2015-08-09 NOTE — Assessment & Plan Note (Signed)
He is still hesitant to start art.  i asked that we recheck his labs today.  if  CD4 < 200- he will be called and started on lab.  rtc in 6 months.

## 2015-08-09 NOTE — Progress Notes (Signed)
   Subjective:    Patient ID: Cole Thomas, male    DOB: 11/22/1981, 34 y.o.   MRN: [id number]  HPI 34 yo M with hx of HIV+ since 2012 and previously on atripla. He has taken ART sparingly until he returned 06-2014 and wanted to be started on ART. He was started on stribild. He has since stopped taking this as he did not have more energy and did not feel better.  He remains off ART.  He is still having some sinus fullness. His ears feel better  HIV 1 RNA QUANT (copies/mL)  Date Value  01/03/2015 10038*  09/15/2014 40981*  07/22/2012 19147*   HIV-1 RNA VIRAL LOAD (no units)  Date Value  02/11/2013 50000   CD4 T CELL ABS  Date Value  01/04/2015 260 /uL*  09/15/2014 380 /uL*  02/11/2013 382   Is having post-prandial dumping. This has improved a little with cutting back on fast food.   Review of Systems  Constitutional: Negative for appetite change and unexpected weight change.  Gastrointestinal: Negative for constipation and blood in stool.  Genitourinary: Negative for difficulty urinating.       Objective:   Physical Exam  Constitutional: He appears well-developed and well-nourished.  HENT:  Mouth/Throat: No oropharyngeal exudate.  Eyes: EOM are normal. Pupils are equal, round, and reactive to light.  Neck: Neck supple.  Cardiovascular: Normal rate, regular rhythm and normal heart sounds.   Pulmonary/Chest: Effort normal and breath sounds normal.  Abdominal: Soft. Bowel sounds are normal. There is no tenderness. There is no rebound.  Musculoskeletal: He exhibits no edema.  Lymphadenopathy:    He has no cervical adenopathy.       Assessment & Plan:

## 2015-08-09 NOTE — Assessment & Plan Note (Signed)
Asked him to take zyrtec daily as well as flonase daily for 2 weeks.

## 2015-08-10 LAB — HIV-1 RNA QUANT-NO REFLEX-BLD
HIV 1 RNA QUANT: 26560 {copies}/mL — AB (ref <20)
HIV-1 RNA Quant, Log: 4.42 {Log} — ABNORMAL HIGH (ref <1.30)

## 2015-08-10 LAB — RPR

## 2015-08-11 LAB — URINE CYTOLOGY ANCILLARY ONLY
CHLAMYDIA, DNA PROBE: NEGATIVE
NEISSERIA GONORRHEA: NEGATIVE

## 2015-08-14 LAB — T-HELPER CELL (CD4) - (RCID CLINIC ONLY)
CD4 % Helper T Cell: 18 % — ABNORMAL LOW (ref 33–55)
CD4 T Cell Abs: 320 /uL — ABNORMAL LOW (ref 400–2700)

## 2015-12-20 ENCOUNTER — Encounter: Payer: Self-pay | Admitting: Infectious Diseases

## 2015-12-20 ENCOUNTER — Ambulatory Visit (INDEPENDENT_AMBULATORY_CARE_PROVIDER_SITE_OTHER): Payer: Self-pay | Admitting: Infectious Diseases

## 2015-12-20 ENCOUNTER — Other Ambulatory Visit: Payer: Self-pay | Admitting: Infectious Diseases

## 2015-12-20 VITALS — BP 126/84 | HR 77 | Temp 98.4°F | Ht 67.0 in | Wt 183.4 lb

## 2015-12-20 DIAGNOSIS — A630 Anogenital (venereal) warts: Secondary | ICD-10-CM

## 2015-12-20 DIAGNOSIS — B20 Human immunodeficiency virus [HIV] disease: Secondary | ICD-10-CM

## 2015-12-20 DIAGNOSIS — Z79899 Other long term (current) drug therapy: Secondary | ICD-10-CM

## 2015-12-20 DIAGNOSIS — Z113 Encounter for screening for infections with a predominantly sexual mode of transmission: Secondary | ICD-10-CM

## 2015-12-20 NOTE — Assessment & Plan Note (Signed)
He denies recurrence.  Clean anoscopy 02-2015

## 2015-12-20 NOTE — Progress Notes (Signed)
   Subjective:    Patient ID: Cole Thomas, male    DOB: [birth year], 35 y.o.   MRN: [id number]  HPI 35 yo M with hx of HIV+ since 2012 and previously on atripla. He has taken ART sparingly until he returned 06-2014 and wanted to be started on ART. He was started on stribild. He has since stopped taking this as he did not have more energy and did not feel better.  He remains off ART.  Today states he feels pretty good- some GI issues, having intermittent diarrhea (2-3d/wk). Has quit drinking coffee. Has cut back fast food. Cole Thomas is able to go several weeks without issues. Has never kept a food journal.   HIV 1 RNA QUANT (copies/mL)  Date Value  08/09/2015 26560*  01/03/2015 10038*  09/15/2014 62952*   HIV-1 RNA VIRAL LOAD (no units)  Date Value  02/11/2013 50000   CD4 T CELL ABS (/uL)  Date Value  08/09/2015 320*  01/04/2015 260*  09/15/2014 380*   Review of Systems  Constitutional: Negative for appetite change and unexpected weight change.  Cardiovascular: Negative for chest pain.  Gastrointestinal: Positive for diarrhea. Negative for constipation.  Genitourinary: Negative for difficulty urinating.  Neurological: Positive for headaches.  having "stress headaches"- pounding, frontal. Relieved with sleep.  Started going to gym again.      Objective:   Physical Exam  Constitutional: He appears well-developed and well-nourished.  HENT:  Mouth/Throat: No oropharyngeal exudate.  Eyes: EOM are normal. Pupils are equal, round, and reactive to light.  Neck: Neck supple.  Cardiovascular: Normal rate, regular rhythm and normal heart sounds.   Pulmonary/Chest: Effort normal and breath sounds normal.  Abdominal: Soft. Bowel sounds are normal. There is no tenderness. There is no rebound.  Musculoskeletal: He exhibits no edema.  Lymphadenopathy:    He has no cervical adenopathy.      Assessment & Plan:

## 2015-12-20 NOTE — Assessment & Plan Note (Addendum)
His vax are up to date.  Offered/refused condoms.  Spoke about starting ART, he agrees if CD4 dropping.  Will check his labs today.  rtc 3-4 months

## 2015-12-22 ENCOUNTER — Telehealth: Payer: Self-pay | Admitting: *Deleted

## 2015-12-22 LAB — CYTOLOGY, (ORAL, ANAL, URETHRAL) ANCILLARY ONLY
CHLAMYDIA, DNA PROBE: POSITIVE — AB
Neisseria Gonorrhea: POSITIVE — AB

## 2015-12-22 NOTE — Telephone Encounter (Signed)
Called patient for results and instructions.  Voicemail stated "please leave a message for [phone]"  RN did not leave message.  Will send MyChart message for patient. Andree Coss, RN

## 2015-12-22 NOTE — Telephone Encounter (Signed)
-----   Message from [name], MD sent at 12/22/2015  9:57 AM EST ----- Regarding: lab Pt needs Ceftriaxone  IM once Pt needs azithromycin 1gram po once

## 2015-12-25 NOTE — Telephone Encounter (Signed)
Spoke with patient. He will come 2/1 at 8:30 for ceftriaxone, azithromycin per Dr Ninetta Lights.  Patient also on lab schedule that day to complete the other labs that were not collected 1/25 due to the late time. Andree Coss, RN

## 2015-12-27 ENCOUNTER — Other Ambulatory Visit: Payer: Self-pay

## 2015-12-27 ENCOUNTER — Ambulatory Visit (INDEPENDENT_AMBULATORY_CARE_PROVIDER_SITE_OTHER): Payer: Self-pay | Admitting: *Deleted

## 2015-12-27 DIAGNOSIS — Z79899 Other long term (current) drug therapy: Secondary | ICD-10-CM

## 2015-12-27 DIAGNOSIS — B20 Human immunodeficiency virus [HIV] disease: Secondary | ICD-10-CM

## 2015-12-27 DIAGNOSIS — A64 Unspecified sexually transmitted disease: Secondary | ICD-10-CM

## 2015-12-27 DIAGNOSIS — Z113 Encounter for screening for infections with a predominantly sexual mode of transmission: Secondary | ICD-10-CM

## 2015-12-27 LAB — CBC
HEMATOCRIT: 40.2 % (ref 39.0–52.0)
Hemoglobin: 13.8 g/dL (ref 13.0–17.0)
MCH: 28.9 pg (ref 26.0–34.0)
MCHC: 34.3 g/dL (ref 30.0–36.0)
MCV: 84.1 fL (ref 78.0–100.0)
MPV: 9.3 fL (ref 8.6–12.4)
Platelets: 189 10*3/uL (ref 150–400)
RBC: 4.78 MIL/uL (ref 4.22–5.81)
RDW: 12.9 % (ref 11.5–15.5)
WBC: 3.6 10*3/uL — ABNORMAL LOW (ref 4.0–10.5)

## 2015-12-27 LAB — COMPREHENSIVE METABOLIC PANEL
ALBUMIN: 3.9 g/dL (ref 3.6–5.1)
ALT: 32 U/L (ref 9–46)
AST: 29 U/L (ref 10–40)
Alkaline Phosphatase: 64 U/L (ref 40–115)
BILIRUBIN TOTAL: 0.4 mg/dL (ref 0.2–1.2)
BUN: 12 mg/dL (ref 7–25)
CHLORIDE: 103 mmol/L (ref 98–110)
CO2: 24 mmol/L (ref 20–31)
Calcium: 9 mg/dL (ref 8.6–10.3)
Creat: 0.83 mg/dL (ref 0.60–1.35)
Glucose, Bld: 76 mg/dL (ref 65–99)
Potassium: 4 mmol/L (ref 3.5–5.3)
Sodium: 137 mmol/L (ref 135–146)
TOTAL PROTEIN: 8.3 g/dL — AB (ref 6.1–8.1)

## 2015-12-27 LAB — LIPID PANEL
CHOL/HDL RATIO: 2.7 ratio (ref <=5.0)
CHOLESTEROL: 152 mg/dL (ref 125–200)
HDL: 56 mg/dL (ref >=40)
LDL Cholesterol: 86 mg/dL (ref <130)
TRIGLYCERIDES: 49 mg/dL (ref <150)
VLDL: 10 mg/dL (ref <30)

## 2015-12-27 MED ORDER — CEFTRIAXONE SODIUM 1 G IJ SOLR
250.0000 mg | Freq: Once | INTRAMUSCULAR | Status: AC
  Administered 2015-12-27: 250 mg via INTRAMUSCULAR

## 2015-12-27 MED ORDER — AZITHROMYCIN 250 MG PO TABS
1000.0000 mg | ORAL_TABLET | Freq: Once | ORAL | Status: AC
  Administered 2015-12-27: 1000 mg via ORAL

## 2015-12-27 NOTE — Addendum Note (Signed)
Addended by: Mariea Clonts D on: 12/27/2015 04:51 PM   Modules accepted: Orders

## 2015-12-27 NOTE — Progress Notes (Signed)
STI treatment per Dr. Ninetta Lights.

## 2015-12-28 LAB — HIV-1 RNA QUANT-NO REFLEX-BLD
HIV 1 RNA Quant: 63661 copies/mL — ABNORMAL HIGH (ref <20)
HIV-1 RNA QUANT, LOG: 4.8 {Log_copies}/mL — AB (ref <1.30)

## 2015-12-28 LAB — T-HELPER CELL (CD4) - (RCID CLINIC ONLY)
CD4 % Helper T Cell: 14 % — ABNORMAL LOW (ref 33–55)
CD4 T Cell Abs: 220 /uL — ABNORMAL LOW (ref 400–2700)

## 2015-12-28 LAB — RPR

## 2015-12-29 LAB — URINE CYTOLOGY ANCILLARY ONLY
CHLAMYDIA, DNA PROBE: NEGATIVE
Neisseria Gonorrhea: NEGATIVE

## 2016-04-11 ENCOUNTER — Other Ambulatory Visit: Payer: Self-pay

## 2016-04-11 DIAGNOSIS — B20 Human immunodeficiency virus [HIV] disease: Secondary | ICD-10-CM

## 2016-04-12 LAB — T-HELPER CELL (CD4) - (RCID CLINIC ONLY)
CD4 % Helper T Cell: 12 % — ABNORMAL LOW (ref 33–55)
CD4 T CELL ABS: 330 /uL — AB (ref 400–2700)

## 2016-04-15 LAB — HIV-1 RNA QUANT-NO REFLEX-BLD
HIV 1 RNA QUANT: 23663 {copies}/mL — AB (ref <20)
HIV-1 RNA QUANT, LOG: 4.37 {Log_copies}/mL — AB (ref <1.30)

## 2016-04-18 ENCOUNTER — Encounter: Payer: Self-pay | Admitting: Infectious Diseases

## 2016-04-18 ENCOUNTER — Ambulatory Visit (INDEPENDENT_AMBULATORY_CARE_PROVIDER_SITE_OTHER): Payer: Self-pay | Admitting: Infectious Diseases

## 2016-04-18 VITALS — BP 129/88 | HR 87 | Temp 98.1°F | Wt 181.0 lb

## 2016-04-18 DIAGNOSIS — Z113 Encounter for screening for infections with a predominantly sexual mode of transmission: Secondary | ICD-10-CM

## 2016-04-18 DIAGNOSIS — B20 Human immunodeficiency virus [HIV] disease: Secondary | ICD-10-CM

## 2016-04-18 MED ORDER — ELVITEG-COBIC-EMTRICIT-TENOFAF 150-150-200-10 MG PO TABS: 1.0000 | ORAL_TABLET | Freq: Every day | ORAL | Status: DC

## 2016-04-18 NOTE — Assessment & Plan Note (Signed)
Interested in starting ART today.  Will write rx for genvoya. Pharm to see.  Will see him back in 4 weeks.  Offered/refused condoms. Has "plenty"

## 2016-04-18 NOTE — Addendum Note (Signed)
Addended by: Sherrel Shafer C on: 04/18/2016 04:21 PM   Modules accepted: Orders

## 2016-04-18 NOTE — Progress Notes (Signed)
   Subjective:    Patient ID: Cole Thomas, male    DOB: 10/17/1981, 35 y.o.   MRN: [id number]  HPI 35 yo M with hx of HIV+ since 2012 and previously on atripla. He has taken ART sparingly until he returned 06-2014 and wanted to be started on ART. He was started on stribild. He has since stopped taking this as he did not have more energy and did not feel better.  He remains off ART.  Today complains of issues with his allergies. Frequent cough. No rhinnorhea, no headaches, no sinus congestion. Has been taking allegra/generic. Some incomplete relief.  Has otherwise been feeling well.   HIV 1 RNA QUANT (copies/mL)  Date Value  04/11/2016 [id number]*  12/27/2015 1914763661*  08/09/2015 8295626560*   HIV-1 RNA VIRAL LOAD (no units)  Date Value  02/11/2013 50000   CD4 T CELL ABS (/uL)  Date Value  04/11/2016 330*  12/27/2015 220*  08/09/2015 320*   Has same partner, has disclosed. Using condoms sometimes.   Review of Systems  Constitutional: Negative for fever, chills, appetite change and unexpected weight change.  HENT: Negative for rhinorrhea and sinus pressure.   Respiratory: Positive for cough.   Gastrointestinal: Negative for constipation and blood in stool.  Genitourinary: Negative for difficulty urinating and genital sores.  not exercising or watching diet.      Objective:   Physical Exam  Constitutional: He appears well-developed and well-nourished.  HENT:  Mouth/Throat: No oropharyngeal exudate.  Eyes: EOM are normal. Pupils are equal, round, and reactive to light.  Neck: Neck supple.  Cardiovascular: Normal rate, regular rhythm and normal heart sounds.   Pulmonary/Chest: Effort normal and breath sounds normal.  Abdominal: Soft. Bowel sounds are normal. There is no tenderness. There is no rebound.  Lymphadenopathy:    He has no cervical adenopathy.       Assessment & Plan:

## 2016-04-18 NOTE — Assessment & Plan Note (Signed)
Worried he has repeat infection. Has inconsistent condom use.

## 2016-04-19 LAB — RPR TITER

## 2016-04-19 LAB — RPR: RPR Ser Ql: REACTIVE — AB

## 2016-04-19 LAB — FLUORESCENT TREPONEMAL AB(FTA)-IGG-BLD: Fluorescent Treponemal ABS: REACTIVE — AB

## 2016-04-19 NOTE — Addendum Note (Signed)
Addended by: Mariea ClontsGREEN, Quincy Prisco D on: 04/19/2016 12:31 PM   Modules accepted: Orders

## 2016-04-19 NOTE — Addendum Note (Signed)
Addended by: Lurlean LeydenPOOLE, TRAVIS F on: 04/19/2016 12:24 PM   Modules accepted: Orders

## 2016-04-23 LAB — CYTOLOGY, (ORAL, ANAL, URETHRAL) ANCILLARY ONLY
CHLAMYDIA, DNA PROBE: POSITIVE — AB
Neisseria Gonorrhea: POSITIVE — AB

## 2016-04-24 ENCOUNTER — Encounter: Payer: Self-pay | Admitting: *Deleted

## 2016-04-24 ENCOUNTER — Telehealth: Payer: Self-pay | Admitting: *Deleted

## 2016-04-24 NOTE — Telephone Encounter (Signed)
Left patient a voice mail asking that he call the nurse triage line to go over lab results with him. Please have patient schedule an appt for treatment. Wendall MolaJacqueline Cockerham

## 2016-04-24 NOTE — Telephone Encounter (Signed)
-----   Message from Ginnie SmartJeffrey C Hatcher, MD sent at 04/23/2016  3:56 PM EDT ----- Pt needs ceftriaxone 250mg  IM x 1 and azithro 1g po x 1  rec partner testing  Retest for chlamydia in 3 months  ----- Message -----    From: [facility] Interface    Sent: 04/19/2016   4:54 AM      To: Ginnie SmartJeffrey C Hatcher, MD

## 2016-04-24 NOTE — Telephone Encounter (Signed)
Pt returned phone call.  Will obtain treatment for STD tomorrow, 04/25/16.

## 2016-04-25 ENCOUNTER — Other Ambulatory Visit: Payer: Self-pay

## 2016-04-25 ENCOUNTER — Ambulatory Visit: Payer: Self-pay

## 2016-04-25 ENCOUNTER — Ambulatory Visit (INDEPENDENT_AMBULATORY_CARE_PROVIDER_SITE_OTHER): Payer: Self-pay | Admitting: *Deleted

## 2016-04-25 DIAGNOSIS — A749 Chlamydial infection, unspecified: Secondary | ICD-10-CM

## 2016-04-25 DIAGNOSIS — A549 Gonococcal infection, unspecified: Secondary | ICD-10-CM

## 2016-04-26 LAB — URINE CYTOLOGY ANCILLARY ONLY
CHLAMYDIA, DNA PROBE: NEGATIVE
NEISSERIA GONORRHEA: NEGATIVE

## 2016-04-26 MED ORDER — AZITHROMYCIN 250 MG PO TABS
1000.0000 mg | ORAL_TABLET | Freq: Once | ORAL | Status: AC
  Administered 2016-04-25: 1000 mg via ORAL

## 2016-04-26 MED ORDER — CEFTRIAXONE SODIUM 1 G IJ SOLR
250.0000 mg | Freq: Once | INTRAMUSCULAR | Status: AC
  Administered 2016-04-25: 250 mg via INTRAMUSCULAR

## 2016-04-26 NOTE — Progress Notes (Signed)
Pt educated about STI transmission.  Pt offered/refused condoms.  Stated that he had plenty at home.  Stated that he would use condoms with every sexual encounter.

## 2016-04-26 NOTE — Patient Instructions (Signed)
Please avoid sexual encounters for the next 7 days.  Please use condoms for all sexual encounters.  Please instruct all sexual partners to obtain testing for chlamydia and gonorrhea.

## 2016-05-14 ENCOUNTER — Ambulatory Visit: Payer: Self-pay | Admitting: Infectious Diseases

## 2016-06-06 ENCOUNTER — Ambulatory Visit: Payer: Self-pay

## 2017-01-20 ENCOUNTER — Other Ambulatory Visit (HOSPITAL_COMMUNITY)
Admission: RE | Admit: 2017-01-20 | Discharge: 2017-01-20 | Disposition: A | Discharge status: Home / Self Care | Payer: Self-pay | Source: Ambulatory Visit | Attending: Infectious Diseases | Admitting: Infectious Diseases

## 2017-01-20 ENCOUNTER — Ambulatory Visit (INDEPENDENT_AMBULATORY_CARE_PROVIDER_SITE_OTHER): Payer: Self-pay

## 2017-01-20 ENCOUNTER — Encounter: Payer: Self-pay | Admitting: Infectious Diseases

## 2017-01-20 ENCOUNTER — Ambulatory Visit (INDEPENDENT_AMBULATORY_CARE_PROVIDER_SITE_OTHER): Payer: Self-pay | Admitting: Infectious Diseases

## 2017-01-20 ENCOUNTER — Other Ambulatory Visit: Payer: Self-pay | Admitting: Pharmacist Clinician (PhC)/ Clinical Pharmacy Specialist

## 2017-01-20 VITALS — BP 114/84 | HR 79 | Temp 98.2°F | Ht 67.0 in | Wt 171.0 lb

## 2017-01-20 DIAGNOSIS — A630 Anogenital (venereal) warts: Secondary | ICD-10-CM

## 2017-01-20 DIAGNOSIS — Z23 Encounter for immunization: Secondary | ICD-10-CM

## 2017-01-20 DIAGNOSIS — Z79899 Other long term (current) drug therapy: Secondary | ICD-10-CM

## 2017-01-20 DIAGNOSIS — B20 Human immunodeficiency virus [HIV] disease: Secondary | ICD-10-CM

## 2017-01-20 DIAGNOSIS — Z113 Encounter for screening for infections with a predominantly sexual mode of transmission: Secondary | ICD-10-CM

## 2017-01-20 DIAGNOSIS — J309 Allergic rhinitis, unspecified: Secondary | ICD-10-CM

## 2017-01-20 LAB — COMPREHENSIVE METABOLIC PANEL
ALT: 16 U/L (ref 9–46)
AST: 22 U/L (ref 10–40)
Albumin: 4.1 g/dL (ref 3.6–5.1)
Alkaline Phosphatase: 65 U/L (ref 40–115)
BUN: 11 mg/dL (ref 7–25)
CHLORIDE: 104 mmol/L (ref 98–110)
CO2: 26 mmol/L (ref 20–31)
Calcium: 9.3 mg/dL (ref 8.6–10.3)
Creat: 0.97 mg/dL (ref 0.60–1.35)
Glucose, Bld: 80 mg/dL (ref 65–99)
POTASSIUM: 4.2 mmol/L (ref 3.5–5.3)
Sodium: 139 mmol/L (ref 135–146)
TOTAL PROTEIN: 8.6 g/dL — AB (ref 6.1–8.1)
Total Bilirubin: 0.4 mg/dL (ref 0.2–1.2)

## 2017-01-20 LAB — CBC
HEMATOCRIT: 40.4 % (ref 38.5–50.0)
Hemoglobin: 13.8 g/dL (ref 13.2–17.1)
MCH: 28.9 pg (ref 27.0–33.0)
MCHC: 34.2 g/dL (ref 32.0–36.0)
MCV: 84.7 fL (ref 80.0–100.0)
MPV: 9.2 fL (ref 7.5–12.5)
Platelets: 203 10*3/uL (ref 140–400)
RBC: 4.77 MIL/uL (ref 4.20–5.80)
RDW: 13.7 % (ref 11.0–15.0)
WBC: 4.2 10*3/uL (ref 3.8–10.8)

## 2017-01-20 LAB — LIPID PANEL
CHOL/HDL RATIO: 3 ratio (ref <5.0)
CHOLESTEROL: 169 mg/dL (ref <200)
HDL: 57 mg/dL (ref >40)
LDL Cholesterol: 98 mg/dL (ref <100)
Triglycerides: 71 mg/dL (ref <150)
VLDL: 14 mg/dL (ref <30)

## 2017-01-20 MED ORDER — ELVITEG-COBIC-EMTRICIT-TENOFAF 150-150-200-10 MG PO TABS: 1.0000 | ORAL_TABLET | Freq: Every day | ORAL | 3 refills | Status: DC

## 2017-01-20 MED ORDER — DOLUTEGRAVIR SODIUM 50 MG PO TABS: 50.0000 mg | ORAL_TABLET | Freq: Every day | ORAL | 5 refills | Status: DC

## 2017-01-20 MED ORDER — EMTRICITABINE-TENOFOVIR AF 200-25 MG PO TABS: 1.0000 | ORAL_TABLET | Freq: Every day | ORAL | 5 refills | Status: DC

## 2017-01-20 NOTE — Progress Notes (Signed)
   Subjective:    Patient ID: Cole Thomas, male    DOB: 01/04/1981, 36 y.o.   MRN: [id number]  HPI 36 yo M with hx of HIV+ since 2012 and previously on atripla. He has taken ART sparingly until he returned 06-2014 and wanted to be started on ART. He was started on stribild then quit as he did not feel better.  He was seen in May 2017 and started on genvoya.  He has not been on ART.  Has been feeling well except for cough. Tried allergy rx, cough medication.   HIV 1 RNA Quant (copies/mL)  Date Value  04/11/2016 23,663 (H)  12/27/2015 40,98163,661 (H)  08/09/2015 26,560 (H)   HIV-1 RNA Viral Load (no units)  Date Value  02/11/2013 50,000   CD4 T Cell Abs (/uL)  Date Value  04/11/2016 330 (L)  12/27/2015 220 (L)  08/09/2015 320 (L)    Review of Systems  Constitutional: Negative for appetite change, chills, fever and unexpected weight change.  HENT: Positive for voice change. Negative for postnasal drip, rhinorrhea and trouble swallowing.   Respiratory: Positive for cough.   Gastrointestinal: Positive for constipation. Negative for diarrhea.  Genitourinary: Negative for difficulty urinating, dysuria, frequency and hematuria.  Neurological: Negative for headaches.      Objective:   Physical Exam  Constitutional: He appears well-developed and well-nourished.  HENT:  Mouth/Throat: No oropharyngeal exudate.  Eyes: EOM are normal. Pupils are equal, round, and reactive to light.  Neck: Neck supple.  Cardiovascular: Normal rate, regular rhythm and normal heart sounds.   Pulmonary/Chest: Effort normal and breath sounds normal.  Abdominal: Soft. Bowel sounds are normal. There is no tenderness. There is no rebound.  Musculoskeletal: He exhibits no edema.  Lymphadenopathy:    He has no cervical adenopathy.       Assessment & Plan:

## 2017-01-20 NOTE — Addendum Note (Signed)
Addended by: Andree CossHOWELL, Roddie Riegler M on: 01/20/2017 12:00 PM   Modules accepted: Orders

## 2017-01-20 NOTE — Progress Notes (Signed)
HPI: Cole Thomas is a 36 y.o. male who fell out of care with us and is here for care again.   Allergies: Allergies  Allergen Reactions  . Atripla [Efavirenz-Emtricitab-Tenofovir]     Nausea, vomiting and abdominal pain    Vitals: Temp: 98.2 F (36.8 C) (02/26 1031) Temp Source: Oral (02/26 1031) BP: 114/84 (02/26 1031) Pulse Rate: 79 (02/26 1031)  Past Medical History: Past Medical History:  Diagnosis Date  . HIV infection [facility](HCC)     Social History: Social History   Social History  . Marital status: Single    Spouse name: N/A  . Number of children: N/A  . Years of education: N/A   Social History Main Topics  . Smoking status: Never Smoker  . Smokeless tobacco: Never Used  . Alcohol use No  . Drug use: No  . Sexual activity: Not Currently    Partners: Male     Comment: Anal pap- negative 10-2010, refused condoms   Other Topics Concern  . None   Social History Narrative  . None    Previous Regimen: ATP, ATV/r, Stribild  Current Regimen: None  Labs: HIV 1 RNA Quant (copies/mL)  Date Value  04/11/2016 23,663 (H)  12/27/2015 16,10963,661 (H)  08/09/2015 26,560 (H)   HIV-1 RNA Viral Load (no units)  Date Value  02/11/2013 50,000   CD4 T Cell Abs (/uL)  Date Value  04/11/2016 330 (L)  12/27/2015 220 (L)  08/09/2015 320 (L)   Hep B S Ab (no units)  Date Value  12/12/2011 NEG   Hepatitis B Surface Ag (no units)  Date Value  12/12/2011 NEGATIVE   HCV Ab (no units)  Date Value  12/12/2011 NEGATIVE    CrCl: CrCl cannot be calculated (Patient's most recent lab result is older than the maximum 21 days allowed.).  Lipids:    Component Value Date/Time   CHOL 152 12/27/2015 1153   TRIG 49 12/27/2015 1153   HDL 56 12/27/2015 1153   CHOLHDL 2.7 12/27/2015 1153   VLDL 10 12/27/2015 1153   LDLCALC 86 12/27/2015 1153    Assessment: Cole Thomas has not been seen since 2017 for his HIV. He has been on several different things listed above. He is  currently off of meds now and his ADAP is filled out today. This will take 4-6 wks for approval. Dr. Ninetta LightsHatcher restarted him on [place] but he is going to take Flonase so there will be an interactions. I changed the Rx to DTG/Descovy instead to avoid the interactions. We are going to file [facility] first until his ADAP is approved. Meds will be mailed to him. Stressed compliance while we were doing the application.   Recommendations:  Change Genvoya to DTG/Descovy  [facility] until ADAP is approved  Ulyses SouthwardMinh Pham, PharmD, BCPS, AAHIVP, CPP Clinical Infectious Disease Pharmacist [facility] 01/20/2017, 12:18 PM

## 2017-01-20 NOTE — Assessment & Plan Note (Signed)
Will restart his genvoya.  He needs help for adherence.  Mening vax today.  refused condoms rtc in 2 months.  Wants STI testing.

## 2017-01-20 NOTE — Assessment & Plan Note (Addendum)
Denies recurrence.  Anal PAP today.

## 2017-01-20 NOTE — Assessment & Plan Note (Signed)
Trial of flonase 

## 2017-01-21 ENCOUNTER — Telehealth: Payer: Self-pay | Admitting: *Deleted

## 2017-01-21 ENCOUNTER — Ambulatory Visit (INDEPENDENT_AMBULATORY_CARE_PROVIDER_SITE_OTHER): Payer: Self-pay | Admitting: *Deleted

## 2017-01-21 DIAGNOSIS — A549 Gonococcal infection, unspecified: Secondary | ICD-10-CM

## 2017-01-21 DIAGNOSIS — A749 Chlamydial infection, unspecified: Secondary | ICD-10-CM

## 2017-01-21 LAB — CYTOLOGY, (ORAL, ANAL, URETHRAL) ANCILLARY ONLY
Chlamydia: NEGATIVE
Chlamydia: POSITIVE — AB
Neisseria Gonorrhea: NEGATIVE
Neisseria Gonorrhea: POSITIVE — AB

## 2017-01-21 LAB — URINE CYTOLOGY ANCILLARY ONLY
CHLAMYDIA, DNA PROBE: NEGATIVE
NEISSERIA GONORRHEA: NEGATIVE

## 2017-01-21 LAB — RPR

## 2017-01-21 MED ORDER — CEFTRIAXONE SODIUM 250 MG IJ SOLR
250.0000 mg | Freq: Once | INTRAMUSCULAR | Status: AC
  Administered 2017-01-21: 250 mg via INTRAMUSCULAR

## 2017-01-21 MED ORDER — AZITHROMYCIN 250 MG PO TABS
1000.0000 mg | ORAL_TABLET | Freq: Once | ORAL | Status: AC
  Administered 2017-01-21: 1000 mg via ORAL

## 2017-01-21 NOTE — Telephone Encounter (Signed)
Patient notified and will come in today for treatment. Lucifer Soja CMA  

## 2017-01-21 NOTE — Telephone Encounter (Signed)
-----   Message from Ginnie SmartJeffrey C Hatcher, MD sent at 01/21/2017  1:57 PM EST ----- Pt needs azithromycin 1gram orally and ceftriaxone 250mg  IMx 1 Thanks  ----- Message ----- From: Interface, Lab [facility] Sent: 01/20/2017   5:13 PM To: Ginnie SmartJeffrey C Hatcher, MD

## 2017-01-22 ENCOUNTER — Telehealth: Payer: Self-pay

## 2017-01-22 LAB — HIV-1 RNA QUANT-NO REFLEX-BLD
HIV 1 RNA QUANT: 78100 {copies}/mL — AB
HIV-1 RNA QUANT, LOG: 4.89 {Log_copies}/mL — AB

## 2017-01-22 LAB — T-HELPER CELL (CD4) - (RCID CLINIC ONLY)
CD4 % Helper T Cell: 12 % — ABNORMAL LOW (ref 33–55)
CD4 T Cell Abs: 200 /uL — ABNORMAL LOW (ref 400–2700)

## 2017-01-22 NOTE — Telephone Encounter (Signed)
Error/tkk  

## 2017-02-02 ENCOUNTER — Emergency Department (HOSPITAL_COMMUNITY): Payer: Self-pay

## 2017-02-02 ENCOUNTER — Encounter (HOSPITAL_COMMUNITY): Payer: Self-pay | Admitting: Emergency Medicine

## 2017-02-02 ENCOUNTER — Emergency Department (HOSPITAL_COMMUNITY)
Admission: EM | Admit: 2017-02-02 | Discharge: 2017-02-02 | Disposition: A | Discharge status: Home / Self Care | Payer: Self-pay | Attending: Emergency Medicine | Admitting: Emergency Medicine

## 2017-02-02 DIAGNOSIS — R109 Unspecified abdominal pain: Secondary | ICD-10-CM

## 2017-02-02 DIAGNOSIS — R1031 Right lower quadrant pain: Secondary | ICD-10-CM | POA: Insufficient documentation

## 2017-02-02 DIAGNOSIS — R197 Diarrhea, unspecified: Secondary | ICD-10-CM | POA: Insufficient documentation

## 2017-02-02 LAB — URINALYSIS, ROUTINE W REFLEX MICROSCOPIC
BILIRUBIN URINE: NEGATIVE
Glucose, UA: NEGATIVE mg/dL
Hgb urine dipstick: NEGATIVE
Ketones, ur: NEGATIVE mg/dL
Nitrite: NEGATIVE
PH: 5 (ref 5.0–8.0)
Protein, ur: 30 mg/dL — AB
SPECIFIC GRAVITY, URINE: 1.028 (ref 1.005–1.030)

## 2017-02-02 LAB — CBC
HCT: 40.1 % (ref 39.0–52.0)
Hemoglobin: 13.7 g/dL (ref 13.0–17.0)
MCH: 28.7 pg (ref 26.0–34.0)
MCHC: 34.2 g/dL (ref 30.0–36.0)
MCV: 84.1 fL (ref 78.0–100.0)
PLATELETS: 200 10*3/uL (ref 150–400)
RBC: 4.77 MIL/uL (ref 4.22–5.81)
RDW: 13.1 % (ref 11.5–15.5)
WBC: 9.2 10*3/uL (ref 4.0–10.5)

## 2017-02-02 LAB — COMPREHENSIVE METABOLIC PANEL
ALK PHOS: 72 U/L (ref 38–126)
ALT: 22 U/L (ref 17–63)
AST: 25 U/L (ref 15–41)
Albumin: 3.8 g/dL (ref 3.5–5.0)
Anion gap: 8 (ref 5–15)
BUN: 7 mg/dL (ref 6–20)
CALCIUM: 9.4 mg/dL (ref 8.9–10.3)
CO2: 26 mmol/L (ref 22–32)
Chloride: 102 mmol/L (ref 101–111)
Creatinine, Ser: 1.29 mg/dL — ABNORMAL HIGH (ref 0.61–1.24)
Glucose, Bld: 95 mg/dL (ref 65–99)
Potassium: 4 mmol/L (ref 3.5–5.1)
Sodium: 136 mmol/L (ref 135–145)
Total Bilirubin: 0.9 mg/dL (ref 0.3–1.2)
Total Protein: 9.5 g/dL — ABNORMAL HIGH (ref 6.5–8.1)

## 2017-02-02 LAB — LIPASE, BLOOD: Lipase: 14 U/L (ref 11–51)

## 2017-02-02 LAB — I-STAT CG4 LACTIC ACID, ED: Lactic Acid, Venous: 0.99 mmol/L (ref 0.5–1.9)

## 2017-02-02 MED ORDER — IOPAMIDOL (ISOVUE-300) INJECTION 61%
INTRAVENOUS | Status: AC
  Administered 2017-02-02: 100 mL
  Filled 2017-02-02: qty 100

## 2017-02-02 MED ORDER — SODIUM CHLORIDE 0.9 % IV BOLUS (SEPSIS)
1000.0000 mL | Freq: Once | INTRAVENOUS | Status: AC
  Administered 2017-02-02: 1000 mL via INTRAVENOUS

## 2017-02-02 MED ORDER — ONDANSETRON HCL 4 MG/2ML IJ SOLN
4.0000 mg | Freq: Once | INTRAMUSCULAR | Status: AC
  Administered 2017-02-02: 4 mg via INTRAVENOUS
  Filled 2017-02-02: qty 2

## 2017-02-02 MED ORDER — ONDANSETRON 4 MG PO TBDP: 4.0000 mg | ORAL_TABLET | Freq: Three times a day (TID) | ORAL | 0 refills | Status: DC | PRN

## 2017-02-02 MED ORDER — MORPHINE SULFATE (PF) 4 MG/ML IV SOLN
4.0000 mg | Freq: Once | INTRAVENOUS | Status: AC | PRN
  Administered 2017-02-02: 4 mg via INTRAVENOUS
  Filled 2017-02-02: qty 1

## 2017-02-02 NOTE — ED Triage Notes (Signed)
Patient with abdominal pain for the last two days.  He states that he has been having very loose stools, states that it was more like liquid than solid.  He states that the pain has been continuing, felt like his abdomen has been churning, sharp pain.

## 2017-02-02 NOTE — ED Provider Notes (Signed)
[facility]-EMERGENCY DEPT Provider Note   CSN: [id number] Arrival date & time: 02/02/17  0543     History   Chief Complaint Chief Complaint  Patient presents with  . Abdominal Pain    HPI Cole Thomas is a 36 y.o. male.  The history is provided by the patient and medical records. No language interpreter was used.  Abdominal Pain   Associated symptoms include diarrhea and nausea. Pertinent negatives include fever, vomiting, dysuria and headaches.   Cole Thomas is a 36 y.o. male  with a PMH of HIV who presents to the Emergency Department complaining of worsening abdominal pain that began x 2 days. Patient states that Friday night (2 days ago), he felt "quesy" but didn't think much of it. The next morning, he awoke with central abdominal pain which progressively worsened throughout the day. This morning when he awoke, he felt a squeezing, twisting pain to his right lower abdomen. Associated symptoms nausea and 2-3 episodes of non-bloody diarrhea. Denies vomiting, fevers, chest pain, shortness of breath, back pain, dysuria, penile discharge.    Past Medical History:  Diagnosis Date  . HIV infection [facility](HCC)     Patient Active Problem List   Diagnosis Date Noted  . Allergic rhinitis 02/06/2015  . Sore throat 01/03/2015  . Screening examination for venereal disease 03/16/2014  . Encounter for long-term (current) use of other medications 03/16/2014  . Rash and nonspecific skin eruption 03/16/2014  . History of chlamydia 06/14/2011  . 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 Human immunodeficiency virus (HIV) disease (HCC) 09/29/2006    History reviewed. No pertinent surgical history.     Home Medications    Prior to Admission medications   Medication Sig Start Date End Date Taking? Authorizing Provider  diphenhydrAMINE (SOMINEX) 25 MG tablet Take 25 mg by mouth at bedtime as needed for allergies. Reported on 04/18/2016    Historical Provider, MD    dolutegravir (TIVICAY) 50 MG tablet Take 1 tablet (50 mg total) by mouth daily. 01/20/17   Ginnie SmartJeffrey C Hatcher, MD  emtricitabine-tenofovir AF (DESCOVY) 200-25 MG tablet Take 1 tablet by mouth daily. 01/20/17   Ginnie SmartJeffrey C Hatcher, MD  fexofenadine (ALLEGRA) 180 MG tablet Take 180 mg by mouth daily. Reported on 04/18/2016    Historical Provider, MD  ibuprofen (ADVIL,MOTRIN) 600 MG tablet Take 1 tablet (600 mg total) by mouth every 6 (six) hours as needed. Patient not taking: Reported on 12/20/2015 04/09/15   Shon Batonourtney F Horton, MD  ondansetron (ZOFRAN ODT) 4 MG disintegrating tablet Take 1 tablet (4 mg total) by mouth every 8 (eight) hours as needed for nausea or vomiting. 02/02/17   Chase PicketJaime Pilcher Pragya Lofaso, PA-C    Family History No family history on file.  Social History Social History  Substance Use Topics  . Smoking status: Never Smoker  . Smokeless tobacco: Never Used  . Alcohol use No     Allergies   Atripla [efavirenz-emtricitab-tenofovir]   Review of Systems Review of Systems  Constitutional: Negative for chills and fever.  HENT: Negative for congestion.   Eyes: Negative for visual disturbance.  Respiratory: Negative for cough and shortness of breath.   Cardiovascular: Negative.   Gastrointestinal: Positive for abdominal pain, diarrhea and nausea. Negative for blood in stool and vomiting.  Genitourinary: Negative for discharge, dysuria and testicular pain.  Musculoskeletal: Negative for back pain and neck pain.  Skin: Negative for rash.  Neurological: Negative for headaches.     Physical Exam Updated Vital Signs  BP 120/84 (BP Location: Left Arm)   Pulse 91   Temp 98.7 F (37.1 C) (Oral)   Resp 18   SpO2 98%   Physical Exam  Constitutional: He is oriented to person, place, and time. He appears well-developed and well-nourished. No distress.  HENT:  Head: Normocephalic and atraumatic.  Cardiovascular: Normal heart sounds.   No murmur heard. Tachycardic but regular.    Pulmonary/Chest: Effort normal and breath sounds normal. No respiratory distress. He has no wheezes. He has no rales.  Abdominal: Soft. He exhibits no distension. There is tenderness.  Point tenderness at McBurney's. Negative obturator sign.  Musculoskeletal: He exhibits no edema.  Neurological: He is alert and oriented to person, place, and time.  Skin: Skin is warm and dry.  Nursing note and vitals reviewed.   ED Treatments / Results  Labs (all labs ordered are listed, but only abnormal results are displayed) Labs Reviewed  COMPREHENSIVE METABOLIC PANEL - Abnormal; Notable for the following:       Result Value   Creatinine, Ser 1.29 (*)    Total Protein 9.5 (*)    All other components within normal limits  URINALYSIS, ROUTINE W REFLEX MICROSCOPIC - Abnormal; Notable for the following:    Color, Urine AMBER (*)    APPearance HAZY (*)    Protein, ur 30 (*)    Leukocytes, UA MODERATE (*)    Bacteria, UA RARE (*)    Squamous Epithelial / LPF 0-5 (*)    All other components within normal limits  URINE CULTURE  LIPASE, BLOOD  CBC  I-STAT CG4 LACTIC ACID, ED  GC/CHLAMYDIA PROBE AMP ([place]) NOT AT [facility]    EKG  EKG Interpretation None       Radiology Ct Abdomen Pelvis W Contrast  Result Date: 02/02/2017 CLINICAL DATA:  Abdominal pain and diarrhea EXAM: CT ABDOMEN AND PELVIS WITH CONTRAST TECHNIQUE: Multidetector CT imaging of the abdomen and pelvis was performed using the standard protocol following bolus administration of intravenous contrast. CONTRAST:  ISOVUE-300 IOPAMIDOL (ISOVUE-300) INJECTION 61% COMPARISON:  None. FINDINGS: Lower chest: No acute abnormality. Hepatobiliary: No focal liver abnormality is seen. No gallstones, gallbladder wall thickening, or biliary dilatation. Pancreas: Unremarkable. No pancreatic ductal dilatation or surrounding inflammatory changes. Spleen: Normal in size without focal abnormality. Adrenals/Urinary Tract: Adrenal glands are  unremarkable. Kidneys are normal, without renal calculi, focal lesion, or hydronephrosis. Bladder is unremarkable. Stomach/Bowel: Stomach is within normal limits. Appendix appears normal. No evidence of bowel wall thickening, distention, or inflammatory changes. Vascular/Lymphatic: No significant vascular findings are present. No enlarged abdominal or pelvic lymph nodes. Reproductive: Prostate is unremarkable. Other: No abdominal wall hernia or abnormality. No abdominopelvic ascites. Musculoskeletal: No acute or significant osseous findings. IMPRESSION: No acute abnormality noted. Electronically Signed   By: Alcide Clever M.D.   On: 02/02/2017 09:34    Procedures Procedures (including critical care time)  Medications Ordered in ED Medications  sodium chloride 0.9 % bolus 1,000 mL (0 mLs Intravenous Stopped 02/02/17 0925)  ondansetron (ZOFRAN) injection 4 mg (4 mg Intravenous Given 02/02/17 0741)  morphine 4 MG/ML injection 4 mg (4 mg Intravenous Given 02/02/17 0741)  iopamidol (ISOVUE-300) 61 % injection (100 mLs  Contrast Given 02/02/17 0910)     Initial Impression / Assessment and Plan / ED Course  I have reviewed the triage vital signs and the nursing notes.  Pertinent labs & imaging results that were available during my care of the patient were reviewed by me and considered in  my medical decision making (see chart for details).    Cole Thomas is a 36 y.o. male who presents to ED for worsening abdominal pain x 2 days. Initially started as feeling queasy and central abdominal pain, now developing right lower quadrant pain. On exam, patient is afebrile, tachycardic with focal area of tenderness at McBurney's. Will obtain CT scan for further evaluation, concern for appendicitis. Fluids, pain control, nausea control initiated.  UA reviewed: moderate leuks, 0-5 white cells, rare bacteria, 0-5 squamous. Patient with no urinary or GU complaints. Urine sent for culture. Recommended not treating  given no urinary/GU symptoms, but offered ABX. Patient agrees to hold on treatment at this time.   Labs reviewed: Creatinine elevated at 1.29, otherwise reassuring.   CT scan with no acute abnormalities, appendix appears normal.   Patient re-evaluated. Feels improved. Tolerating PO well with no emesis while in ED. Evaluation does not show pathology that would require ongoing emergent intervention or inpatient treatment. Rx for Zofran as needed for nausea. Follow up with the infectious disease if symptoms not improving in the next day or 2. Return precautions discussed. Patient is comfortable with discharge to home and plan as dictated above. All questions answered.  Final Clinical Impressions(s) / ED Diagnoses   Final diagnoses:  Abdominal pain  Diarrhea, unspecified type    New Prescriptions New Prescriptions   ONDANSETRON (ZOFRAN ODT) 4 MG DISINTEGRATING TABLET    Take 1 tablet (4 mg total) by mouth every 8 (eight) hours as needed for nausea or vomiting.     [facility] Mailani Degroote, PA-C 02/02/17 1119    Tilden Fossa, MD 02/08/17 [phone]

## 2017-02-02 NOTE — ED Notes (Signed)
Pt given gingerale for fluid challenge. 

## 2017-02-02 NOTE — ED Notes (Signed)
Patient transported to CT 

## 2017-02-02 NOTE — Discharge Instructions (Signed)
Increase hydration. Please follow up with Dr. Ninetta LightsHatcher if symptoms are not improving in the next day or two.  Return to ER for fevers, new or worsening symptoms, any additional concerns.

## 2017-02-02 NOTE — ED Notes (Signed)
Pt. Able to tolerate fluids at this time.  

## 2017-02-03 LAB — URINE CULTURE

## 2017-02-03 LAB — GC/CHLAMYDIA PROBE AMP (~~LOC~~) NOT AT ARMC
CHLAMYDIA, DNA PROBE: NEGATIVE
NEISSERIA GONORRHEA: NEGATIVE

## 2017-02-09 ENCOUNTER — Encounter (HOSPITAL_COMMUNITY): Payer: Self-pay | Admitting: Emergency Medicine

## 2017-02-09 DIAGNOSIS — R1084 Generalized abdominal pain: Secondary | ICD-10-CM | POA: Insufficient documentation

## 2017-02-09 DIAGNOSIS — Z79899 Other long term (current) drug therapy: Secondary | ICD-10-CM | POA: Insufficient documentation

## 2017-02-09 DIAGNOSIS — K625 Hemorrhage of anus and rectum: Secondary | ICD-10-CM | POA: Insufficient documentation

## 2017-02-09 LAB — CBC
HEMATOCRIT: 37.4 % — AB (ref 39.0–52.0)
HEMOGLOBIN: 12.8 g/dL — AB (ref 13.0–17.0)
MCH: 28.8 pg (ref 26.0–34.0)
MCHC: 34.2 g/dL (ref 30.0–36.0)
MCV: 84.2 fL (ref 78.0–100.0)
Platelets: 241 10*3/uL (ref 150–400)
RBC: 4.44 MIL/uL (ref 4.22–5.81)
RDW: 13.4 % (ref 11.5–15.5)
WBC: 8 10*3/uL (ref 4.0–10.5)

## 2017-02-09 LAB — URINALYSIS, ROUTINE W REFLEX MICROSCOPIC
BACTERIA UA: NONE SEEN
GLUCOSE, UA: NEGATIVE mg/dL
Hgb urine dipstick: NEGATIVE
KETONES UR: 20 mg/dL — AB
Leukocytes, UA: NEGATIVE
NITRITE: NEGATIVE
PROTEIN: 100 mg/dL — AB
Specific Gravity, Urine: 1.033 — ABNORMAL HIGH (ref 1.005–1.030)
Squamous Epithelial / LPF: NONE SEEN
pH: 5 (ref 5.0–8.0)

## 2017-02-09 LAB — COMPREHENSIVE METABOLIC PANEL
ALBUMIN: 3.5 g/dL (ref 3.5–5.0)
ALT: 17 U/L (ref 17–63)
ANION GAP: 11 (ref 5–15)
AST: 23 U/L (ref 15–41)
Alkaline Phosphatase: 61 U/L (ref 38–126)
BUN: 6 mg/dL (ref 6–20)
CHLORIDE: 102 mmol/L (ref 101–111)
CO2: 23 mmol/L (ref 22–32)
Calcium: 8.8 mg/dL — ABNORMAL LOW (ref 8.9–10.3)
Creatinine, Ser: 1.05 mg/dL (ref 0.61–1.24)
GFR calc Af Amer: >60 mL/min (ref >60)
GFR calc non Af Amer: >60 mL/min (ref >60)
GLUCOSE: 90 mg/dL (ref 65–99)
POTASSIUM: 3.2 mmol/L — AB (ref 3.5–5.1)
SODIUM: 136 mmol/L (ref 135–145)
TOTAL PROTEIN: 8.6 g/dL — AB (ref 6.5–8.1)
Total Bilirubin: 0.8 mg/dL (ref 0.3–1.2)

## 2017-02-09 LAB — LIPASE, BLOOD

## 2017-02-09 NOTE — ED Notes (Signed)
Pt called for triage x3. No answer.  

## 2017-02-09 NOTE — ED Triage Notes (Signed)
Patient arrives with complaint of worsened abdominal pain. States he was seen for NVD about 1 month ago. At that time he was advised that he had virus. Over the past 2 weeks. Pain has persisted. Now patient states he is having mucus like bloody bowel movements.

## 2017-02-10 ENCOUNTER — Emergency Department (HOSPITAL_COMMUNITY)
Admission: EM | Admit: 2017-02-10 | Discharge: 2017-02-10 | Disposition: A | Discharge status: Home / Self Care | Payer: Self-pay | Attending: Emergency Medicine | Admitting: Emergency Medicine

## 2017-02-10 DIAGNOSIS — R1084 Generalized abdominal pain: Secondary | ICD-10-CM

## 2017-02-10 DIAGNOSIS — K625 Hemorrhage of anus and rectum: Secondary | ICD-10-CM

## 2017-02-10 MED ORDER — DICYCLOMINE HCL 20 MG PO TABS: 20.0000 mg | ORAL_TABLET | Freq: Three times a day (TID) | ORAL | 0 refills | Status: AC

## 2017-02-10 MED ORDER — DIPHENOXYLATE-ATROPINE 2.5-0.025 MG PO TABS: 1.0000 | ORAL_TABLET | Freq: Four times a day (QID) | ORAL | 0 refills | Status: DC | PRN

## 2017-02-10 NOTE — Discharge Planning (Signed)
Pt called to ask about referral [facility]. EDCM called office to verify faxed was received and that correct fax number was in system.  Office personnel stated that they have a fax log that has not been distributed as of yet and it may take until afternoon or early morning.  EDCM relayed information to patient.

## 2017-02-10 NOTE — ED Notes (Signed)
Rounded on patient and updated on wait times, apologized for wait.

## 2017-02-10 NOTE — ED Notes (Signed)
Pt understood dc material. NAD noted. Scripts given at dc 

## 2017-02-10 NOTE — ED Provider Notes (Signed)
[facility] DEPT Provider Note   CSN: [id number] Arrival date & time: 02/09/17  2121  By signing my name below, I, Elder Negus, attest that this documentation has been prepared under the direction and in the presence of Gilda Crease, MD. Electronically Signed: Elder Negus, Scribe. 02/10/17. 12:17 AM.   History   Chief Complaint Chief Complaint  Patient presents with  . Abdominal Pain  . Rectal Bleeding    HPI Kayron Hicklin is a 36 y.o. male with history of HIV last CD4 200 on 01/20/2017 who presents to the ED for evaluation of rectal bleeding. This patient states that approximately 2 weeks ago he developed occasional lower abdominal cramping with diarrhea and subjective fevers. He was seen at this facility on 3/11 where he received a negative CT abdomen and was discharged. Today, he states that his cramping has worsened with onset of low volume, frank rectal bleeding. He denies any painful bowel movements. Denies any GI history; sister has history of ulcerative colitis.   The history is provided by the patient. No language interpreter was used.  Abdominal Pain   This is a new problem. The current episode started more than 1 week ago. The problem occurs hourly. The problem has been gradually worsening. The pain is associated with an unknown factor. The pain is located in the generalized abdominal region. The pain is mild. Associated symptoms include fever (subjective) and diarrhea. Past workup includes CT scan.    Past Medical History:  Diagnosis Date  . HIV infection [facility])     Patient Active Problem List   Diagnosis Date Noted  . Allergic rhinitis 02/06/2015  . Sore throat 01/03/2015  . Screening examination for venereal disease 03/16/2014  . Encounter for long-term (current) use of other medications 03/16/2014  . Rash and nonspecific skin eruption 03/16/2014  . History of chlamydia 06/14/2011  . 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  Human immunodeficiency virus (HIV) disease (HCC) 09/29/2006    History reviewed. No pertinent surgical history.     Home Medications    Prior to Admission medications   Medication Sig Start Date End Date Taking? Authorizing Provider  dicyclomine (BENTYL) 20 MG tablet Take 1 tablet (20 mg total) by mouth 3 (three) times daily before meals. 02/10/17   Gilda Crease, MD  diphenhydrAMINE (SOMINEX) 25 MG tablet Take 25 mg by mouth at bedtime as needed for allergies. Reported on 04/18/2016    Historical Provider, MD  diphenoxylate-atropine (LOMOTIL) 2.5-0.025 MG tablet Take 1 tablet by mouth 4 (four) times daily as needed for diarrhea or loose stools. 02/10/17   Gilda Crease, MD  dolutegravir (TIVICAY) 50 MG tablet Take 1 tablet (50 mg total) by mouth daily. 01/20/17   [name], MD  emtricitabine-tenofovir AF (DESCOVY) 200-25 MG tablet Take 1 tablet by mouth daily. 01/20/17   [name], MD  fexofenadine (ALLEGRA) 180 MG tablet Take 180 mg by mouth daily. Reported on 04/18/2016    Historical Provider, MD  ibuprofen (ADVIL,MOTRIN) 600 MG tablet Take 1 tablet (600 mg total) by mouth every 6 (six) hours as needed. Patient not taking: Reported on 12/20/2015 04/09/15   Shon Baton, MD  ondansetron (ZOFRAN ODT) 4 MG disintegrating tablet Take 1 tablet (4 mg total) by mouth every 8 (eight) hours as needed for nausea or vomiting. 02/02/17   Chase Picket Ward, PA-C    Family History History reviewed. No pertinent family history.  Social History Social History  Substance Use  Topics  . Smoking status: Never Smoker  . Smokeless tobacco: Never Used  . Alcohol use No     Allergies   Atripla [efavirenz-emtricitab-tenofovir]   Review of Systems Review of Systems  Constitutional: Positive for fever (subjective).  Gastrointestinal: Positive for abdominal pain and diarrhea.  All other systems reviewed and are negative.    Physical Exam Updated Vital Signs BP (!)  143/93 (BP Location: Left Arm)   Pulse (!) 108   Temp 98.6 F (37 C) (Oral)   Resp 16   Ht 5\' 7"  (1.702 m)   Wt 165 lb (74.8 kg)   SpO2 90%   BMI 25.84 kg/m   Physical Exam  Constitutional: He is oriented to person, place, and time. He appears well-developed and well-nourished. No distress.  HENT:  Head: Normocephalic and atraumatic.  Right Ear: Hearing normal.  Left Ear: Hearing normal.  Nose: Nose normal.  Mouth/Throat: Oropharynx is clear and moist and mucous membranes are normal.  Eyes: Conjunctivae and EOM are normal. Pupils are equal, round, and reactive to light.  Neck: Normal range of motion. Neck supple.  Cardiovascular: Regular rhythm, S1 normal and S2 normal.  Exam reveals no gallop and no friction rub.   No murmur heard. Pulmonary/Chest: Effort normal and breath sounds normal. No respiratory distress. He exhibits no tenderness.  Abdominal: Soft. Normal appearance and bowel sounds are normal. There is no hepatosplenomegaly. There is no rebound, no guarding, no tenderness at McBurney's point and negative Murphy's sign. No hernia.  Mild diffuse abdominal tenderness.   Musculoskeletal: Normal range of motion.  Neurological: He is alert and oriented to person, place, and time. He has normal strength. No cranial nerve deficit or sensory deficit. Coordination normal. GCS eye subscore is 4. GCS verbal subscore is 5. GCS motor subscore is 6.  Skin: Skin is warm, dry and intact. No rash noted. No cyanosis.  Psychiatric: He has a normal mood and affect. His speech is normal and behavior is normal. Thought content normal.  Nursing note and vitals reviewed.    ED Treatments / Results  Labs (all labs ordered are listed, but only abnormal results are displayed) Labs Reviewed  LIPASE, BLOOD - Abnormal; Notable for the following:       Result Value   Lipase <10 (*)    All other components within normal limits  COMPREHENSIVE METABOLIC PANEL - Abnormal; Notable for the following:      Potassium 3.2 (*)    Calcium 8.8 (*)    Total Protein 8.6 (*)    All other components within normal limits  CBC - Abnormal; Notable for the following:    Hemoglobin 12.8 (*)    HCT 37.4 (*)    All other components within normal limits  URINALYSIS, ROUTINE W REFLEX MICROSCOPIC - Abnormal; Notable for the following:    Color, Urine AMBER (*)    Specific Gravity, Urine 1.033 (*)    Bilirubin Urine SMALL (*)    Ketones, ur 20 (*)    Protein, ur 100 (*)    All other components within normal limits    EKG  EKG Interpretation None       Radiology No results found.  Procedures Procedures (including critical care time)  Medications Ordered in ED Medications - No data to display   Initial Impression / Assessment and Plan / ED Course  I have reviewed the triage vital signs and the nursing notes.  Pertinent labs & imaging results that were available during my care of  the patient were reviewed by me and considered in my medical decision making (see chart for details).     Patient presents with continued abdominal discomfort. He was seen on March 11. At that time his workup was negative. This workup included a CT scan. Patient did have some improvement initially, now in the last couple of days has been having diffuse abdominal cramping, blood in his stools. His vital signs are stable. Blood work is stable. No evidence of significant anemia requiring hospitalization or emergent workup. Patient will need to follow-up with gastroenterology. He does have a sister who has ulcerative colitis. CT scan at previous visit did not show any inflammatory abnormalities.  Final Clinical Impressions(s) / ED Diagnoses   Final diagnoses:  Rectal bleeding  Generalized abdominal pain    New Prescriptions New Prescriptions   DICYCLOMINE (BENTYL) 20 MG TABLET    Take 1 tablet (20 mg total) by mouth 3 (three) times daily before meals.   DIPHENOXYLATE-ATROPINE (LOMOTIL) 2.5-0.025 MG TABLET    Take  1 tablet by mouth 4 (four) times daily as needed for diarrhea or loose stools.  I personally performed the services described in this documentation, which was scribed in my presence. The recorded information has been reviewed and is accurate.    Gilda Creasehristopher J Addysin Porco, MD 02/10/17 [phone]

## 2017-02-12 ENCOUNTER — Telehealth: Payer: Self-pay | Admitting: *Deleted

## 2017-02-12 NOTE — Telephone Encounter (Signed)
Two ED visits in 2 wks for abd pain, nausea and diarrhea.  Last ED visit was 02/10/17.  RN spoke with the patient.  He had not started the medications prescribed by the ED MD.  Pt wanted to know if they would interfere with his HIV medications.  RN advised that they would not and advised to the patient to have them filled.  The patient was continuing to have abdominal cramping and mild diarrhea.  RN made the patient a return appointment based on Dr. Moshe CiproHatcher's last office note.  Will return on 04/21/17 to see Dr. Ninetta LightsHatcher.

## 2017-02-14 ENCOUNTER — Telehealth: Payer: Self-pay | Admitting: *Deleted

## 2017-02-14 NOTE — Telephone Encounter (Signed)
Pt called regarding referral to [facility].  EDCM reviewed chart to find that communication was sent and verified fax number with [facility] before resending.  EDCM [facility] and printed a hard copy to fax manually.  EDCM received confirmation that fax has been sent manually.  Relayed information to pt.

## 2017-04-08 ENCOUNTER — Other Ambulatory Visit (INDEPENDENT_AMBULATORY_CARE_PROVIDER_SITE_OTHER): Payer: Self-pay

## 2017-04-08 DIAGNOSIS — B20 Human immunodeficiency virus [HIV] disease: Secondary | ICD-10-CM

## 2017-04-08 LAB — CBC WITH DIFFERENTIAL/PLATELET
Basophils Absolute: 44 cells/uL (ref 0–200)
Basophils Relative: 1 %
Eosinophils Absolute: 44 cells/uL (ref 15–500)
Eosinophils Relative: 1 %
HEMATOCRIT: 40.4 % (ref 38.5–50.0)
HEMOGLOBIN: 13.7 g/dL (ref 13.2–17.1)
Lymphocytes Relative: 66 %
Lymphs Abs: 2904 cells/uL (ref 850–3900)
MCH: 28.6 pg (ref 27.0–33.0)
MCHC: 33.9 g/dL (ref 32.0–36.0)
MCV: 84.3 fL (ref 80.0–100.0)
MPV: 8.6 fL (ref 7.5–12.5)
Monocytes Absolute: 440 cells/uL (ref 200–950)
Monocytes Relative: 10 %
NEUTROS PCT: 22 %
Neutro Abs: 968 cells/uL — ABNORMAL LOW (ref 1500–7800)
Platelets: 226 10*3/uL (ref 140–400)
RBC: 4.79 MIL/uL (ref 4.20–5.80)
RDW: 13.9 % (ref 11.0–15.0)
WBC: 4.4 10*3/uL (ref 3.8–10.8)

## 2017-04-08 LAB — COMPREHENSIVE METABOLIC PANEL
ALBUMIN: 3.9 g/dL (ref 3.6–5.1)
ALT: 10 U/L (ref 9–46)
AST: 16 U/L (ref 10–40)
Alkaline Phosphatase: 76 U/L (ref 40–115)
BUN: 11 mg/dL (ref 7–25)
CALCIUM: 9.5 mg/dL (ref 8.6–10.3)
CHLORIDE: 105 mmol/L (ref 98–110)
CO2: 18 mmol/L — AB (ref 20–31)
Creat: 1.07 mg/dL (ref 0.60–1.35)
Glucose, Bld: 81 mg/dL (ref 65–99)
POTASSIUM: 4.2 mmol/L (ref 3.5–5.3)
SODIUM: 140 mmol/L (ref 135–146)
Total Bilirubin: 0.4 mg/dL (ref 0.2–1.2)
Total Protein: 9 g/dL — ABNORMAL HIGH (ref 6.1–8.1)

## 2017-04-09 LAB — T-HELPER CELL (CD4) - (RCID CLINIC ONLY)
CD4 % Helper T Cell: 11 % — ABNORMAL LOW (ref 33–55)
CD4 T CELL ABS: 320 /uL — AB (ref 400–2700)

## 2017-04-10 LAB — HIV-1 RNA QUANT-NO REFLEX-BLD
HIV 1 RNA Quant: <20 copies/mL — AB
HIV-1 RNA QUANT, LOG: DETECTED {Log_copies}/mL — AB

## 2017-04-22 ENCOUNTER — Ambulatory Visit: Payer: Self-pay | Admitting: Infectious Diseases

## 2017-04-23 ENCOUNTER — Encounter: Payer: Self-pay | Admitting: Infectious Diseases

## 2017-04-23 ENCOUNTER — Ambulatory Visit (INDEPENDENT_AMBULATORY_CARE_PROVIDER_SITE_OTHER): Payer: Self-pay | Admitting: Infectious Diseases

## 2017-04-23 DIAGNOSIS — B20 Human immunodeficiency virus [HIV] disease: Secondary | ICD-10-CM

## 2017-04-23 DIAGNOSIS — A630 Anogenital (venereal) warts: Secondary | ICD-10-CM

## 2017-04-23 DIAGNOSIS — J301 Allergic rhinitis due to pollen: Secondary | ICD-10-CM

## 2017-04-23 DIAGNOSIS — Z113 Encounter for screening for infections with a predominantly sexual mode of transmission: Secondary | ICD-10-CM

## 2017-04-23 NOTE — Assessment & Plan Note (Signed)
Will hold on anoscopy for now.

## 2017-04-23 NOTE — Assessment & Plan Note (Signed)
Doing well Encouraged pt My appreciation to pharm Wants to be re-screened for STDs Offered/refused condoms.  rtc 3 months.

## 2017-04-23 NOTE — Assessment & Plan Note (Signed)
Will recheck his STIs today.

## 2017-04-23 NOTE — Assessment & Plan Note (Signed)
Will resume allergy shots.

## 2017-04-23 NOTE — Progress Notes (Signed)
   Subjective:    Patient ID: Cole Thomas, male    DOB: 09/19/1981, 36 y.o.   MRN: [id number]  HPI 36 yo M with hx of HIV+ since 2012 and previously on atripla. He has taken ART sparingly until he returned 06-2014 and wanted to be started on ART. He was started on stribild then quit as he did not feel better.  He was seen in May 2017 and started on genvoya.  Her returned to care 12-2016, off meds. He was started on DTGV/Desc via [facility].  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 He had anal PAP (-).   HIV 1 RNA Quant (copies/mL)  Date Value  04/08/2017 <20 DETECTED (A)  01/20/2017 78,100 (H)  04/11/2016 23,663 (H)   HIV-1 RNA Viral Load (no units)  Date Value  02/11/2013 50,000   CD4 T Cell Abs (/uL)  Date Value  04/08/2017 320 (L)  01/20/2017 200 (L)  04/11/2016 330 (L)   Has been having sore throat, noted 3 small sores in his mouth in last week. No dysphagia, no difficulty eating.  New meds are "fine, no issues".   2 recent ED visits for Gi issues- resolved.   Review of Systems  Constitutional: Positive for unexpected weight change. Negative for appetite change.  HENT: Positive for mouth sores. Negative for sinus pain, sinus pressure, sore throat and trouble swallowing.   Respiratory: Positive for cough. Negative for shortness of breath.   Gastrointestinal: Negative for constipation and diarrhea.  Genitourinary: Negative for difficulty urinating.  Neurological: Negative for headaches.       Objective:   Physical Exam  Constitutional: He appears well-developed and well-nourished.  HENT:  Mouth/Throat: No oropharyngeal exudate.    Eyes: EOM are normal. Pupils are equal, round, and reactive to light.  Neck: Neck supple.  Cardiovascular: Normal rate, regular rhythm and normal heart sounds.   Pulmonary/Chest: Effort normal and breath sounds normal.  Abdominal: Soft. Bowel sounds are normal. There is no tenderness. There is no rebound.  Musculoskeletal: He  exhibits no edema.  Lymphadenopathy:    He has no cervical adenopathy.      Assessment & Plan:

## 2017-04-24 LAB — URINE CYTOLOGY ANCILLARY ONLY
Chlamydia: NEGATIVE
Neisseria Gonorrhea: NEGATIVE

## 2017-04-24 LAB — RPR

## 2017-05-05 ENCOUNTER — Telehealth: Payer: Self-pay | Admitting: *Deleted

## 2017-05-05 NOTE — Telephone Encounter (Signed)
Patient called c/o puss and drainage from his finger, after biting off his fingernail. Asked if he had a primary care MD and he does not. Advised patient to go to Urgent Care for evaluation and he said he does not have insurance to cover the visit. Dr. Ninetta LightsHatcher does not have any openings until August. Asked if it is red, hot to the touch or if he has a fever. He denied all of this. Again encouraged patient to go to Urgent Care and if he does not go, to monitor his finger and if worsens he will need evaluation. Advised him to get some antibiotic ointment, such as bicitricin and to keep the finger clean. He stated he would pick up this ointment. Wendall MolaJacqueline Cockerham CMA

## 2017-07-02 ENCOUNTER — Ambulatory Visit: Payer: Self-pay

## 2017-07-04 ENCOUNTER — Encounter: Payer: Self-pay | Admitting: Infectious Diseases

## 2017-07-17 ENCOUNTER — Other Ambulatory Visit: Payer: Self-pay

## 2017-07-17 DIAGNOSIS — B20 Human immunodeficiency virus [HIV] disease: Secondary | ICD-10-CM

## 2017-07-18 LAB — T-HELPER CELL (CD4) - (RCID CLINIC ONLY)
CD4 % Helper T Cell: 48 % (ref 33–55)
CD4 T Cell Abs: 320 /uL — ABNORMAL LOW (ref 400–2700)

## 2017-07-22 LAB — HIV-1 RNA QUANT-NO REFLEX-BLD
HIV 1 RNA QUANT: DETECTED {copies}/mL — AB
HIV-1 RNA Quant, Log: <1.3 Log copies/mL — AB

## 2017-08-25 ENCOUNTER — Other Ambulatory Visit: Payer: Self-pay

## 2017-09-08 ENCOUNTER — Ambulatory Visit (INDEPENDENT_AMBULATORY_CARE_PROVIDER_SITE_OTHER): Payer: Self-pay | Admitting: Infectious Diseases

## 2017-09-08 ENCOUNTER — Encounter: Payer: Self-pay | Admitting: Infectious Diseases

## 2017-09-08 VITALS — BP 126/80 | HR 82 | Temp 98.0°F | Ht 67.0 in | Wt 177.1 lb

## 2017-09-08 DIAGNOSIS — Z79899 Other long term (current) drug therapy: Secondary | ICD-10-CM

## 2017-09-08 DIAGNOSIS — Z113 Encounter for screening for infections with a predominantly sexual mode of transmission: Secondary | ICD-10-CM

## 2017-09-08 DIAGNOSIS — B20 Human immunodeficiency virus [HIV] disease: Secondary | ICD-10-CM

## 2017-09-08 DIAGNOSIS — Z8619 Personal history of other infectious and parasitic diseases: Secondary | ICD-10-CM

## 2017-09-08 DIAGNOSIS — J301 Allergic rhinitis due to pollen: Secondary | ICD-10-CM

## 2017-09-08 MED ORDER — BICTEGRAVIR-EMTRICITAB-TENOFOV 50-200-25 MG PO TABS: 1.0000 | ORAL_TABLET | Freq: Every day | ORAL | 4 refills | Status: DC

## 2017-09-08 NOTE — Assessment & Plan Note (Signed)
Rectal swab today.

## 2017-09-08 NOTE — Assessment & Plan Note (Addendum)
Would like screening for anal chlamydia at screenings.  Offered/refused condoms, not sexually.  Doing well change to biktravy.  rtc in 6 months

## 2017-09-08 NOTE — Progress Notes (Signed)
   Subjective:    Patient ID: Cole Thomas, male    DOB: December 23, 1980, 36 y.o.   MRN: [id number]  HPI 36 yo M with hx of HIV+ since 2012 and previously on atripla. He has taken ART sparingly until he returned 06-2014 and wanted to be started on ART. He was started on stribild then quit as he did not feel better.  He was seen in May 2017 and started on genvoya.  Her returned to care 12-2016, off meds. He was started on DTGV/Desc via [facility].   HIV 1 RNA Quant (copies/mL)  Date Value  07/17/2017 <20 DETECTED (A)  04/08/2017 <20 DETECTED (A)  01/20/2017 78,100 (H)   HIV-1 RNA Viral Load (no units)  Date Value  02/11/2013 50,000   CD4 T Cell Abs (/uL)  Date Value  07/17/2017 320 (L)  04/08/2017 320 (L)  01/20/2017 200 (L)   No problems with medicines.  Has been feeling well.  Would like to be thinner. sporadic exercise. TIW.   Review of Systems  Constitutional: Negative for appetite change, chills, fever and unexpected weight change.  Respiratory: Positive for cough. Negative for shortness of breath.   Gastrointestinal: Negative for constipation and diarrhea.  Endocrine: Negative for polyuria.  Genitourinary: Negative for difficulty urinating.  Musculoskeletal: Negative for myalgias.  Neurological: Negative for headaches.  Psychiatric/Behavioral: Negative for dysphoric mood and sleep disturbance.      Objective:   Physical Exam  Constitutional: He appears well-developed and well-nourished.  HENT:  Mouth/Throat: No oropharyngeal exudate.  Eyes: Pupils are equal, round, and reactive to light. EOM are normal.  Neck: Neck supple.  Cardiovascular: Normal rate, regular rhythm and normal heart sounds.   Pulmonary/Chest: Effort normal and breath sounds normal.  Abdominal: Soft. Bowel sounds are normal. There is no tenderness. There is no rebound.  Musculoskeletal: He exhibits no edema.  Lymphadenopathy:    He has no cervical adenopathy.  Psychiatric: He has a normal mood and  affect.       Assessment & Plan:

## 2017-09-08 NOTE — Assessment & Plan Note (Addendum)
occas AM cough.  Prn allergy rx.

## 2017-09-10 NOTE — Addendum Note (Signed)
Addended by: Lurlean LeydenPOOLE, Brandilynn Taormina F on: 09/10/2017 09:59 AM   Modules accepted: Orders

## 2017-09-10 NOTE — Addendum Note (Signed)
Addended by: Mariea ClontsGREEN, Zion Lint D on: 09/10/2017 10:02 AM   Modules accepted: Orders

## 2017-09-11 LAB — CYTOLOGY, (ORAL, ANAL, URETHRAL) ANCILLARY ONLY
CHLAMYDIA, DNA PROBE: NEGATIVE
Neisseria Gonorrhea: NEGATIVE

## 2017-09-19 ENCOUNTER — Telehealth: Payer: Self-pay | Admitting: *Deleted

## 2017-09-19 ENCOUNTER — Other Ambulatory Visit: Payer: Self-pay | Admitting: *Deleted

## 2017-09-19 DIAGNOSIS — B20 Human immunodeficiency virus [HIV] disease: Secondary | ICD-10-CM

## 2017-09-19 MED ORDER — BICTEGRAVIR-EMTRICITAB-TENOFOV 50-200-25 MG PO TABS: 1.0000 | ORAL_TABLET | Freq: Every day | ORAL | 3 refills | Status: DC

## 2017-09-19 NOTE — Telephone Encounter (Signed)
Patient left message asking where his prescriptions were sent. He now has ADAP.  Returned the call, letting him know his medications were resent today to [facility].  Andree CossHowell, Peterson Mathey M, RN

## 2017-10-10 ENCOUNTER — Telehealth: Payer: Self-pay

## 2017-10-10 NOTE — Telephone Encounter (Signed)
Patient does not like the new medication that he was put on and would like to go back to his old medication. Patient states that he is currently taking the new medication but wants to be switched to the old medication. Scheduled appointment for 10/21/2017 at 245pm. Towanda OctaveLanatra Teresita Fanton

## 2017-10-13 NOTE — Telephone Encounter (Signed)
Can you change him back to his previous tivicay and descovy? thanks

## 2017-10-14 ENCOUNTER — Other Ambulatory Visit: Payer: Self-pay | Admitting: *Deleted

## 2017-10-14 DIAGNOSIS — B20 Human immunodeficiency virus [HIV] disease: Secondary | ICD-10-CM

## 2017-10-14 MED ORDER — EMTRICITABINE-TENOFOVIR AF 200-25 MG PO TABS: 1.0000 | ORAL_TABLET | Freq: Every day | ORAL | 3 refills | Status: DC

## 2017-10-14 MED ORDER — DOLUTEGRAVIR SODIUM 50 MG PO TABS: 50.0000 mg | ORAL_TABLET | Freq: Every day | ORAL | 3 refills | Status: DC

## 2017-10-14 NOTE — Telephone Encounter (Signed)
Biktarvy discontinued, pharmacy  Notified. Tivicay and Descovy sent to [facility]. Patient notified with voicemail. Andree CossHowell, Angi Goodell M, RN

## 2017-10-21 ENCOUNTER — Ambulatory Visit (INDEPENDENT_AMBULATORY_CARE_PROVIDER_SITE_OTHER): Payer: Self-pay | Admitting: Infectious Diseases

## 2017-10-21 ENCOUNTER — Encounter: Payer: Self-pay | Admitting: Infectious Diseases

## 2017-10-21 VITALS — BP 128/79 | HR 109 | Temp 98.1°F | Wt 182.0 lb

## 2017-10-21 DIAGNOSIS — L0293 Carbuncle, unspecified: Secondary | ICD-10-CM

## 2017-10-21 DIAGNOSIS — Z113 Encounter for screening for infections with a predominantly sexual mode of transmission: Secondary | ICD-10-CM

## 2017-10-21 DIAGNOSIS — B20 Human immunodeficiency virus [HIV] disease: Secondary | ICD-10-CM

## 2017-10-21 DIAGNOSIS — Z79899 Other long term (current) drug therapy: Secondary | ICD-10-CM

## 2017-10-21 MED ORDER — DOXYCYCLINE HYCLATE 100 MG PO TABS: 100.0000 mg | ORAL_TABLET | Freq: Two times a day (BID) | ORAL | 0 refills | Status: DC

## 2017-10-21 NOTE — Progress Notes (Signed)
   Subjective:    Patient ID: Cole Thomas, male    DOB: 05/06/1981, 36 y.o.   MRN: [id number]  HPI 36yo M with hx of HIV+ since 2012 and previously on atripla. He has taken ART sparingly until he returned 06-2014 and wanted to be started on ART. He was started on stribild then quit as he did not feel better.  May 2017 genvoya.  Feb 2018 tivicay/Descovey  08-2017 Biktarvy which he did not like so he went back to tivicay/descovy.   HIV 1 RNA Quant (copies/mL)  Date Value  07/17/2017 <20 DETECTED (A)  04/08/2017 <20 DETECTED (A)  01/20/2017 78,100 (H)   HIV-1 RNA Viral Load (no units)  Date Value  02/11/2013 50,000   CD4 T Cell Abs (/uL)  Date Value  07/17/2017 320 (L)  04/08/2017 320 (L)  01/20/2017 200 (L)   He has been feeling well. Has been getting boils on his R leg. Gluteus. Feels swelling, soreness, discomfort on his anus. Then noted new bump on his gluteus.   Review of Systems  Constitutional: Negative for appetite change, chills, fever and unexpected weight change.  Respiratory: Positive for cough. Negative for shortness of breath.   Gastrointestinal: Negative for constipation and diarrhea.  Genitourinary: Negative for difficulty urinating.  Skin: Positive for wound.       Objective:   Physical Exam  Constitutional: He appears well-developed and well-nourished.  HENT:  Mouth/Throat: No oropharyngeal exudate.  Eyes: EOM are normal. Pupils are equal, round, and reactive to light.  Neck: Neck supple.  Cardiovascular: Normal rate, regular rhythm and normal heart sounds.  Pulmonary/Chest: Effort normal and breath sounds normal.  Abdominal: Soft. Bowel sounds are normal. There is no tenderness. There is no rebound.  Genitourinary: Rectum normal. Rectal exam shows no fissure and no tenderness.  Musculoskeletal: He exhibits no edema.  Lymphadenopathy:    He has no cervical adenopathy.   Did not cough while in exam room.      Assessment & Plan:

## 2017-10-21 NOTE — Assessment & Plan Note (Addendum)
Will stay on his current art Will offer flu vax.  Offered/refused condoms.  Check Hep B S Ab to see if he responded to vaccine.  rtc in 6 months.

## 2017-10-21 NOTE — Assessment & Plan Note (Addendum)
Will give him 2 weeks of doxy Consider anoscopy if not improved.  Wash your clothes in hot water Use antibacterial soap- lever 2000, dial, safegaurd.  Don't re-ware clothes.

## 2017-11-22 ENCOUNTER — Other Ambulatory Visit: Payer: Self-pay | Admitting: Infectious Diseases

## 2017-11-29 IMAGING — CT CT ABD-PELV W/ CM
2 of 4 series · 11 of 46 positions shown, 12 images · IV contrast (Iodine)
Comparison: None.

CLINICAL DATA: Abdominal pain and diarrhea

EXAM:
CT ABDOMEN AND PELVIS WITH CONTRAST
TECHNIQUE: Multidetector CT imaging of the abdomen and pelvis was performed
using the standard protocol following bolus administration of
intravenous contrast.
CONTRAST:  100mL SX4R9D-D11 IOPAMIDOL (SX4R9D-D11) INJECTION 61%

[Series 201: routine, idose (2) · axial · 0.78mm/px · z∈[-42,+293]mm · 8 of 81 slices shown, 9 images]
[im 7/81  soft-tissue]
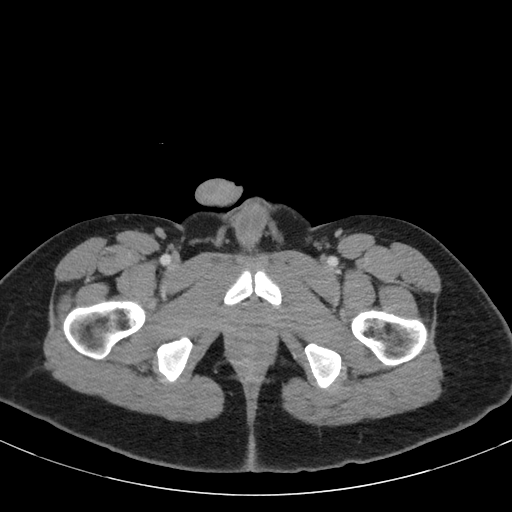
[im 7/81  bone]
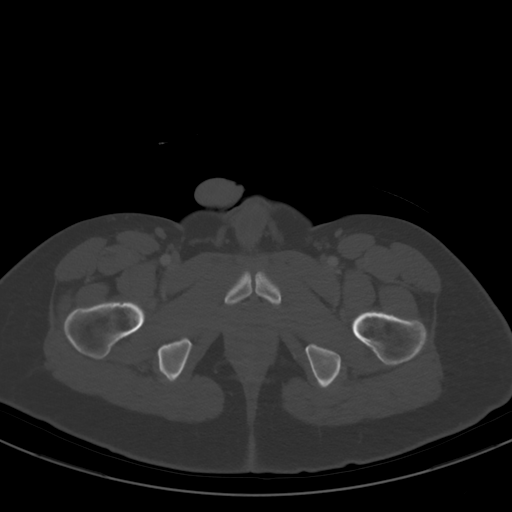
[im 17/81  soft-tissue]
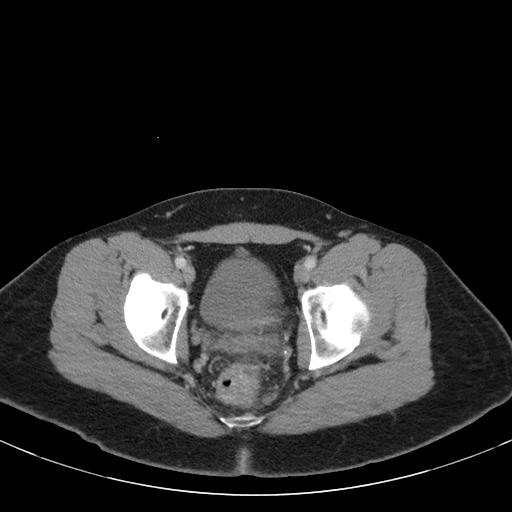
[im 26/81  soft-tissue]
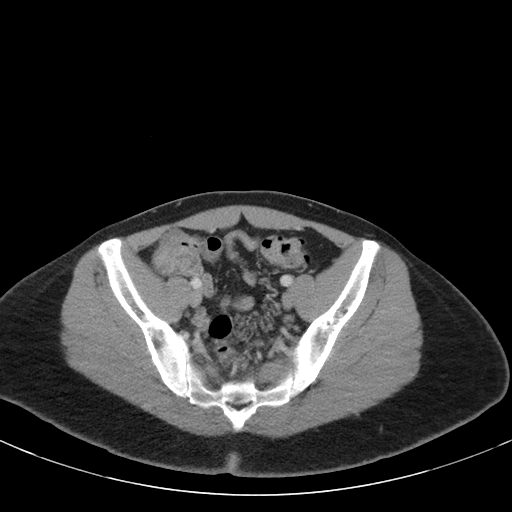
[im 36/81  soft-tissue]
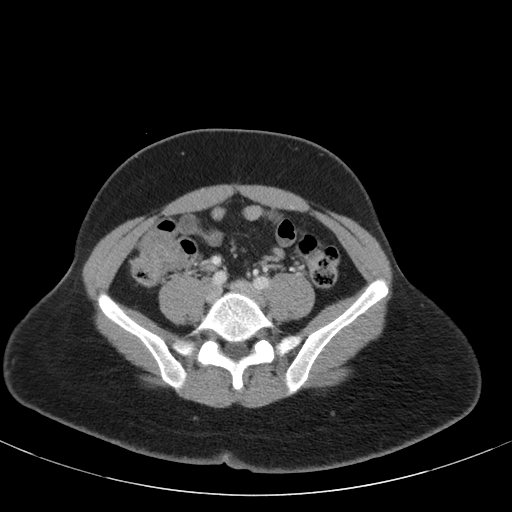
[im 45/81  soft-tissue]
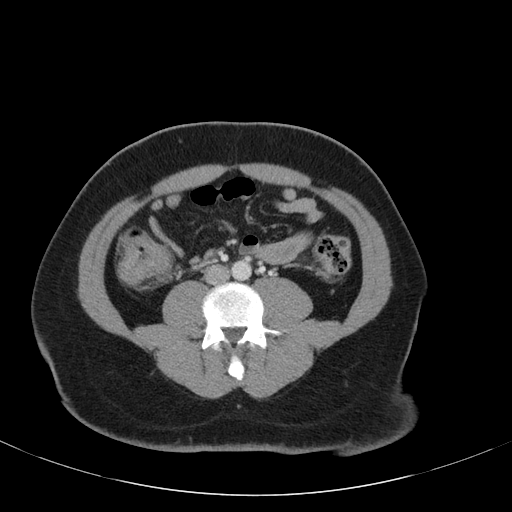
[im 55/81  soft-tissue]
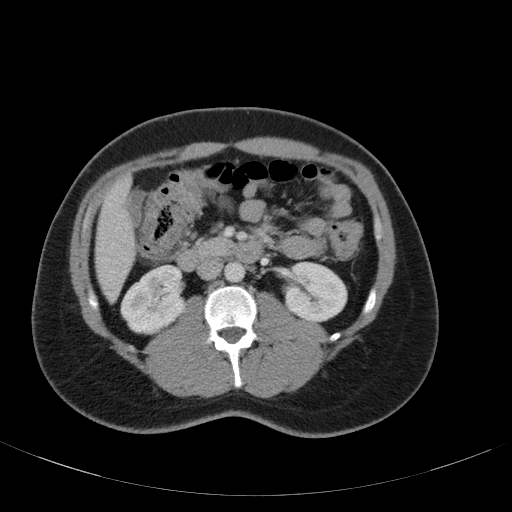
[im 65/81  soft-tissue]
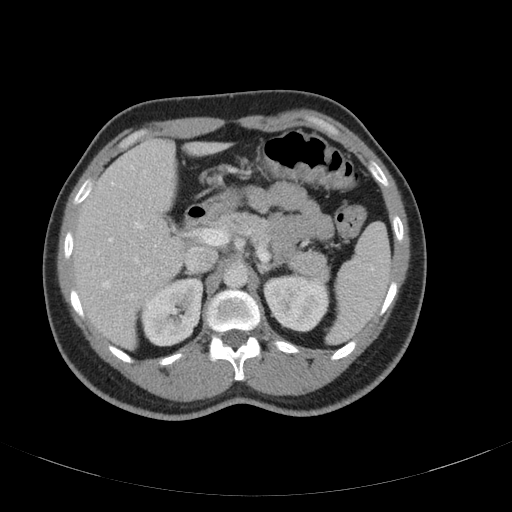
[im 74/81  soft-tissue]
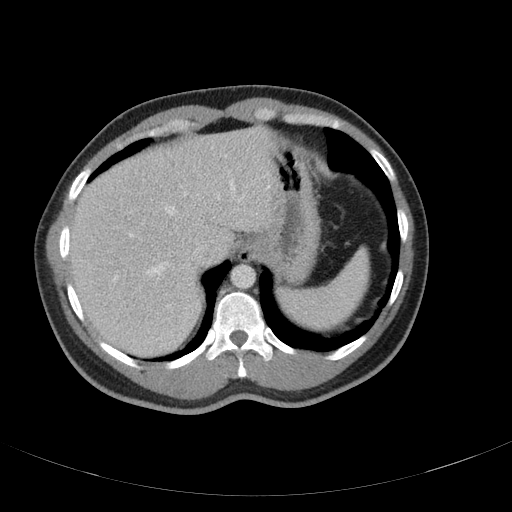

[Series 203: coronals, idose (2) · coronal · 0.45mm/px · 3 of 112 slices shown]
[im 38/112  soft-tissue]
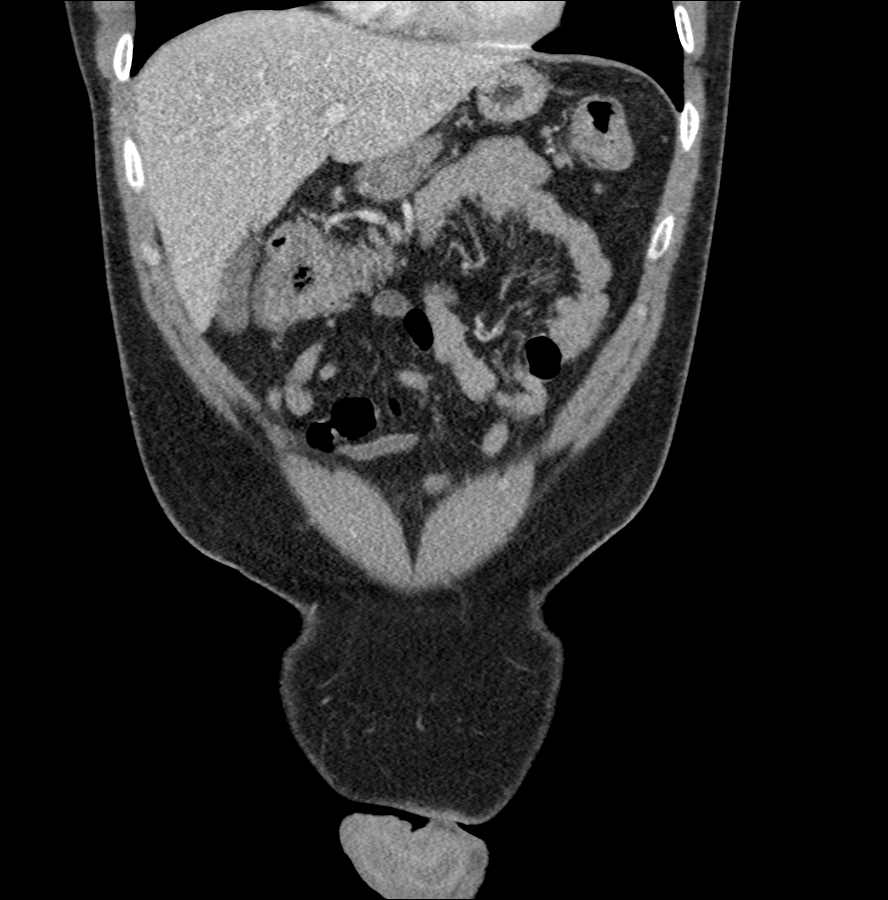
[im 50/112  soft-tissue]
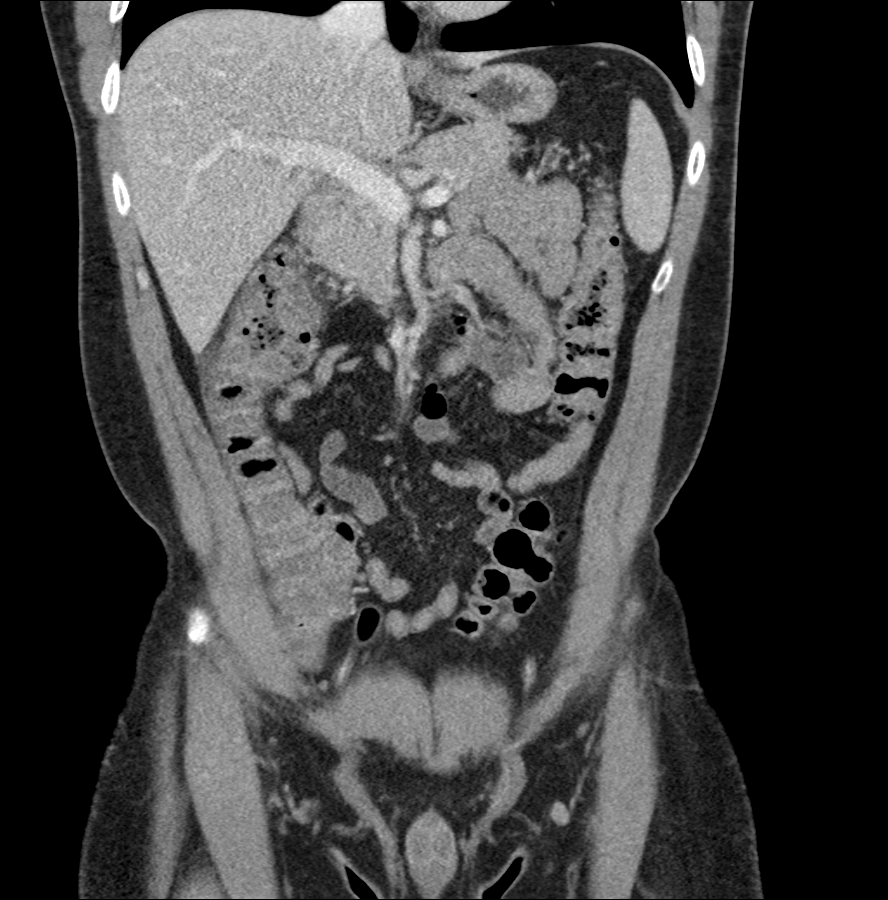
[im 62/112  soft-tissue]
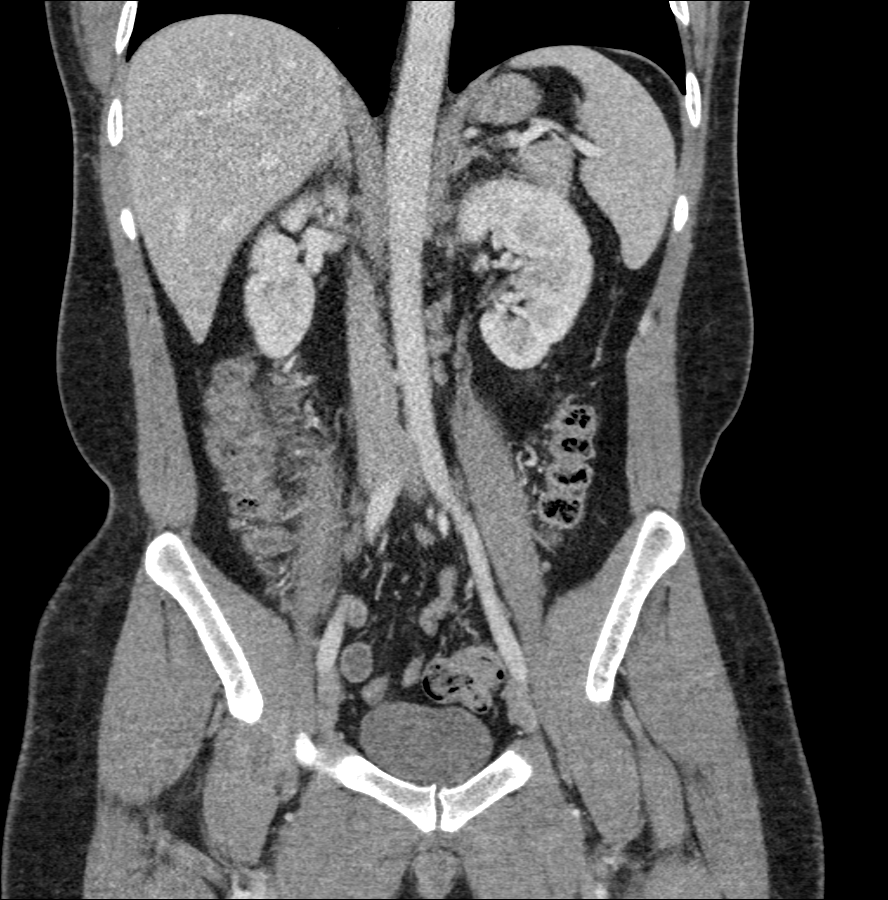

[11 of 46 positions shown; findings below may reference images not displayed]

FINDINGS: Lower chest: No acute abnormality.

Hepatobiliary: No focal liver abnormality is seen. No gallstones,
gallbladder wall thickening, or biliary dilatation.

Pancreas: Unremarkable. No pancreatic ductal dilatation or
surrounding inflammatory changes.

Spleen: Normal in size without focal abnormality.

Adrenals/Urinary Tract: Adrenal glands are unremarkable. Kidneys are
normal, without renal calculi, focal lesion, or hydronephrosis.
Bladder is unremarkable.

Stomach/Bowel: Stomach is within normal limits. Appendix appears
normal. No evidence of bowel wall thickening, distention, or
inflammatory changes.

Vascular/Lymphatic: No significant vascular findings are present. No
enlarged abdominal or pelvic lymph nodes.

Reproductive: Prostate is unremarkable.

Other: No abdominal wall hernia or abnormality. No abdominopelvic
ascites.

Musculoskeletal: No acute or significant osseous findings.
IMPRESSION: No acute abnormality noted.

## 2018-03-12 ENCOUNTER — Other Ambulatory Visit: Payer: Self-pay | Admitting: Infectious Diseases

## 2018-03-12 DIAGNOSIS — B20 Human immunodeficiency virus [HIV] disease: Secondary | ICD-10-CM

## 2018-03-16 ENCOUNTER — Other Ambulatory Visit: Payer: Self-pay | Admitting: Behavioral Health

## 2018-03-16 DIAGNOSIS — B20 Human immunodeficiency virus [HIV] disease: Secondary | ICD-10-CM

## 2018-03-16 MED ORDER — DOLUTEGRAVIR SODIUM 50 MG PO TABS: 50.0000 mg | ORAL_TABLET | Freq: Every day | ORAL | 1 refills | Status: DC

## 2018-03-16 MED ORDER — EMTRICITABINE-TENOFOVIR AF 200-25 MG PO TABS: 1.0000 | ORAL_TABLET | Freq: Every day | ORAL | 1 refills | Status: DC

## 2018-04-01 ENCOUNTER — Other Ambulatory Visit: Payer: Self-pay | Admitting: *Deleted

## 2018-04-01 DIAGNOSIS — B20 Human immunodeficiency virus [HIV] disease: Secondary | ICD-10-CM

## 2018-04-01 MED ORDER — EMTRICITABINE-TENOFOVIR AF 200-25 MG PO TABS: 1.0000 | ORAL_TABLET | Freq: Every day | ORAL | 1 refills | Status: DC

## 2018-04-01 MED ORDER — DOLUTEGRAVIR SODIUM 50 MG PO TABS: 50.0000 mg | ORAL_TABLET | Freq: Every day | ORAL | 1 refills | Status: DC

## 2018-04-01 NOTE — Progress Notes (Signed)
Patient left message asking for Tivicay/Descovy to be sent to [facility]. RN sent prescriptions electronically. Andree Coss, RN

## 2018-04-08 ENCOUNTER — Other Ambulatory Visit: Payer: Self-pay

## 2018-04-22 ENCOUNTER — Ambulatory Visit: Payer: Self-pay | Admitting: Infectious Diseases

## 2018-06-08 ENCOUNTER — Other Ambulatory Visit: Payer: Self-pay | Admitting: Infectious Diseases

## 2018-06-08 DIAGNOSIS — B20 Human immunodeficiency virus [HIV] disease: Secondary | ICD-10-CM

## 2018-06-26 ENCOUNTER — Telehealth: Payer: Self-pay | Admitting: *Deleted

## 2018-06-26 DIAGNOSIS — B20 Human immunodeficiency virus [HIV] disease: Secondary | ICD-10-CM

## 2018-06-26 MED ORDER — DOLUTEGRAVIR SODIUM 50 MG PO TABS: 50.0000 mg | ORAL_TABLET | Freq: Every day | ORAL | 0 refills | Status: DC

## 2018-06-26 MED ORDER — EMTRICITABINE-TENOFOVIR AF 200-25 MG PO TABS: 1.0000 | ORAL_TABLET | Freq: Every day | ORAL | 0 refills | Status: DC

## 2018-06-26 NOTE — Telephone Encounter (Signed)
Patient called asking for refills. His last labs were 06/2017. He no showed his visit in May with Dr Ninetta LightsHatcher. Patient scheduled for 8/19 labs, 8/27 with Dr Ninetta LightsHatcher. RN gave him 30 days supply and advised that he will need to keep his appointment for future refills -- and congratulated him on his undetectable viral load last draw and for pursuing medication refills! Andree CossHowell, [place] M, RN

## 2018-07-13 ENCOUNTER — Other Ambulatory Visit: Payer: Self-pay

## 2018-07-17 ENCOUNTER — Other Ambulatory Visit: Payer: Self-pay | Admitting: Infectious Diseases

## 2018-07-17 DIAGNOSIS — B20 Human immunodeficiency virus [HIV] disease: Secondary | ICD-10-CM

## 2018-07-21 ENCOUNTER — Ambulatory Visit: Payer: Self-pay | Admitting: Infectious Diseases

## 2018-09-25 ENCOUNTER — Telehealth: Payer: Self-pay

## 2018-09-25 NOTE — Telephone Encounter (Signed)
Called patient regarding appointments with our office. Attempted to inform patient that Dr. Ninetta Lights has received a promotion, and will reassigning his patients to other providers in our office. Patient will need to reschedule future appointments with a different provider in our office to continue care. Left message asking patient to call office back regarding appointments. Lorenso Courier, [place]

## 2019-07-12 DIAGNOSIS — A563 Chlamydial infection of anus and rectum: Secondary | ICD-10-CM | POA: Insufficient documentation

## 2021-06-25 ENCOUNTER — Telehealth: Payer: Self-pay | Admitting: *Deleted

## 2021-06-25 NOTE — Telephone Encounter (Signed)
Patient called to see if we had complete immunization records for him, as he needs proof of MMR, chicken pox, hepatitis B, up to date Tdap for school. RN let him know all we had proof of was hepatitis B (although he did not develop immunity, and thus this is not protective) and Tdap (although is out of date and would need to be updated). Patient will call health department in his home county to see if they have record of childhood immunizations.  He has moved out of the area, no longer in [place]. Landis Gandy, RN

## 2023-03-18 ENCOUNTER — Other Ambulatory Visit: Payer: Self-pay | Admitting: Infectious Diseases

## 2023-03-18 ENCOUNTER — Other Ambulatory Visit (HOSPITAL_COMMUNITY)
Admission: RE | Admit: 2023-03-18 | Discharge: 2023-03-18 | Disposition: A | Discharge status: Home / Self Care | Payer: No Typology Code available for payment source | Source: Ambulatory Visit | Attending: Infectious Diseases | Admitting: Infectious Diseases

## 2023-03-18 ENCOUNTER — Ambulatory Visit (INDEPENDENT_AMBULATORY_CARE_PROVIDER_SITE_OTHER): Payer: No Typology Code available for payment source | Admitting: Infectious Diseases

## 2023-03-18 VITALS — BP 138/88 | HR 98 | Temp 100.1°F | Ht 67.0 in | Wt 205.1 lb

## 2023-03-18 DIAGNOSIS — B20 Human immunodeficiency virus [HIV] disease: Secondary | ICD-10-CM | POA: Diagnosis not present

## 2023-03-18 DIAGNOSIS — A630 Anogenital (venereal) warts: Secondary | ICD-10-CM

## 2023-03-18 DIAGNOSIS — Z79899 Other long term (current) drug therapy: Secondary | ICD-10-CM

## 2023-03-18 DIAGNOSIS — Z113 Encounter for screening for infections with a predominantly sexual mode of transmission: Secondary | ICD-10-CM

## 2023-03-18 DIAGNOSIS — A539 Syphilis, unspecified: Secondary | ICD-10-CM

## 2023-03-18 DIAGNOSIS — K625 Hemorrhage of anus and rectum: Secondary | ICD-10-CM

## 2023-03-18 MED ORDER — TIVICAY 50 MG PO TABS
ORAL_TABLET | ORAL | 3 refills | Status: DC
Start: 2023-03-18 — End: 2024-02-24

## 2023-03-18 MED ORDER — DESCOVY 200-25 MG PO TABS: ORAL_TABLET | ORAL | 3 refills | Status: DC

## 2023-03-18 MED ORDER — TIVICAY 50 MG PO TABS: ORAL_TABLET | ORAL | 3 refills | Status: DC

## 2023-03-18 NOTE — Assessment & Plan Note (Signed)
Wil rechcek his RPR at f/u visit in 1 month

## 2023-03-18 NOTE — Assessment & Plan Note (Addendum)
Will restart his ART.  Will see him in 1 month and check his labs then Get him back on [place] Wants STI check today. Given condoms

## 2023-03-18 NOTE — Progress Notes (Signed)
   Subjective:    Patient ID: Cole Thomas, male  DOB: 02-12-1981, 42 y.o.        MRN: [id number]   HPI 42 yo M with hx of HIV+ since 2012 and previously on atripla. He has taken ART sparingly until he returned 06-2014 and wanted to be started on ART. He was started on stribild then quit as he did not feel better.  May 2017 genvoya.  Feb 2018 tivicay/Descovey  08-2017 Biktarvy which he did not like so he went back to tivicay/descovy.  He was seen at [facility].  He moved to [place] for work and has now moved back to [place].  He was on cabaneuva briefly in [place] until his ins ran out. He went back on orals.  He gained some wt while in [place] and "got a little bit older"  He also was screened for prostate Ca while in [place]. He states this came back fine (had fhx).  He would like colon as well. States he had 10 yr ago for rectal bleeding.    HIV 1 RNA Quant (copies/mL)  Date Value  07/17/2017 <20 DETECTED (A)  04/08/2017 <20 DETECTED (A)  01/20/2017 78,100 (H)   HIV-1 RNA Viral Load (no units)  Date Value  02/11/2013 50,000   CD4 T Cell Abs (/uL)  Date Value  07/17/2017 320 (L)  04/08/2017 320 (L)  01/20/2017 200 (L)     Health Maintenance  Topic Date Due  . COVID-19 Vaccine (1) Never done  . DTaP/Tdap/Td (2 - Td or Tdap) 02/27/2020  . INFLUENZA VACCINE  06/26/2023  . Hepatitis C Screening  Completed  . HIV Screening  Completed  . HPV VACCINES  Aged Out      Review of Systems  Constitutional:  Negative for chills, fever and weight loss.  HENT:  Negative for congestion.   Respiratory:  Positive for cough. Negative for shortness of breath.   Gastrointestinal:  Negative for blood in stool, constipation and diarrhea.  Genitourinary:  Negative for dysuria.  Psychiatric/Behavioral:  The patient has insomnia.     Please see HPI. All other systems reviewed and negative.     Objective:  Physical Exam Vitals reviewed.  Constitutional:      Appearance: Normal appearance.  HENT:      Mouth/Throat:     Mouth: Mucous membranes are moist.     Pharynx: No oropharyngeal exudate.  Eyes:     Extraocular Movements: Extraocular movements intact.     Pupils: Pupils are equal, round, and reactive to light.  Cardiovascular:     Rate and Rhythm: Normal rate and regular rhythm.  Pulmonary:     Effort: Pulmonary effort is normal.     Breath sounds: Normal breath sounds.  Abdominal:     General: Bowel sounds are normal. There is no distension.     Palpations: Abdomen is soft.     Tenderness: There is no abdominal tenderness.  Musculoskeletal:     Cervical back: Normal range of motion and neck supple.     Right lower leg: No edema.     Left lower leg: No edema.  Neurological:     General: No focal deficit present.     Mental Status: He is alert.  Psychiatric:        Mood and Affect: Mood normal.          Assessment & Plan:

## 2023-03-18 NOTE — Assessment & Plan Note (Signed)
Was seen for BRBPR prev and had colon Will send for repeat colon.

## 2023-03-19 LAB — URINE CYTOLOGY ANCILLARY ONLY
Chlamydia: NEGATIVE
Comment: NEGATIVE
Comment: NORMAL
Neisseria Gonorrhea: NEGATIVE

## 2023-03-19 LAB — RPR: RPR Ser Ql: REACTIVE — AB

## 2023-03-20 ENCOUNTER — Telehealth: Payer: Self-pay | Admitting: *Deleted

## 2023-03-20 LAB — RPR, QUANT+TP ABS (REFLEX)
Rapid Plasma Reagin, Quant: 1:1 {titer} — ABNORMAL HIGH
T Pallidum Abs: REACTIVE — AB

## 2023-03-20 NOTE — Telephone Encounter (Signed)
Received fax from [facility] with the following results:  RPR: Reactive 1:1   Treponema antibodies: Reactive   Response from PCP: thanks, no need for treatment.   Kinnie Feil, BSN, RN-BC

## 2023-03-25 ENCOUNTER — Telehealth: Payer: Self-pay

## 2023-03-25 ENCOUNTER — Other Ambulatory Visit (HOSPITAL_COMMUNITY): Payer: Self-pay

## 2023-03-25 NOTE — Telephone Encounter (Signed)
error 

## 2023-03-25 NOTE — Telephone Encounter (Signed)
A Prior Authorization was initiated for this patients CABENUVA through CoverMyMeds.   Key: [id number]

## 2023-03-25 NOTE — Telephone Encounter (Signed)
-----   Message from [name], MD sent at 03/18/2023  4:26 PM EDT ----- Pt would like to be on cabaneuva Thanks!

## 2023-03-31 NOTE — Telephone Encounter (Signed)
Prior Auth for patients medication CABENUVA denied by [facility] [employer] via CoverMyMeds.   Reason: medication is not covered under pharmacy benefit. Submit through Medical benefit.   Currently unable to do medical benefit at this location.

## 2023-04-02 ENCOUNTER — Encounter: Payer: Self-pay | Admitting: Gastroenterology

## 2023-04-25 ENCOUNTER — Other Ambulatory Visit: Payer: No Typology Code available for payment source

## 2023-05-22 ENCOUNTER — Other Ambulatory Visit: Payer: Self-pay | Admitting: Infectious Diseases

## 2023-05-22 DIAGNOSIS — B20 Human immunodeficiency virus [HIV] disease: Secondary | ICD-10-CM

## 2023-05-23 ENCOUNTER — Other Ambulatory Visit (HOSPITAL_COMMUNITY)
Admission: RE | Admit: 2023-05-23 | Discharge: 2023-05-23 | Disposition: A | Discharge status: Home / Self Care | Payer: No Typology Code available for payment source | Source: Ambulatory Visit | Attending: Student in an Organized Health Care Education/Training Program | Admitting: Student in an Organized Health Care Education/Training Program

## 2023-05-23 ENCOUNTER — Other Ambulatory Visit (INDEPENDENT_AMBULATORY_CARE_PROVIDER_SITE_OTHER): Payer: No Typology Code available for payment source

## 2023-05-23 DIAGNOSIS — Z79899 Other long term (current) drug therapy: Secondary | ICD-10-CM | POA: Diagnosis not present

## 2023-05-23 DIAGNOSIS — B20 Human immunodeficiency virus [HIV] disease: Secondary | ICD-10-CM

## 2023-05-23 DIAGNOSIS — A539 Syphilis, unspecified: Secondary | ICD-10-CM | POA: Diagnosis not present

## 2023-05-23 LAB — T-HELPER CELL (CD4) - (RCID CLINIC ONLY)
CD4 % Helper T Cell: 16 % — ABNORMAL LOW (ref 33–65)
CD4 T Cell Abs: 316 /uL — ABNORMAL LOW (ref 400–1790)

## 2023-05-24 LAB — CMP14 + ANION GAP
ALT: 31 IU/L (ref 0–44)
Anion Gap: 13 mmol/L (ref 10.0–18.0)
Chloride: 105 mmol/L (ref 96–106)
Glucose: 98 mg/dL (ref 70–99)

## 2023-05-24 LAB — CBC
Hematocrit: 46.3 % (ref 37.5–51.0)
Platelets: 270 10*3/uL (ref 150–450)
RBC: 5.43 x10E6/uL (ref 4.14–5.80)
RDW: 13.7 % (ref 11.6–15.4)

## 2023-05-24 LAB — LIPID PANEL: Triglycerides: 72 mg/dL (ref 0–149)

## 2023-05-24 LAB — RPR, QUANT+TP ABS (REFLEX)

## 2023-05-26 LAB — URINE CYTOLOGY ANCILLARY ONLY
Chlamydia: NEGATIVE
Comment: NEGATIVE
Comment: NORMAL
Neisseria Gonorrhea: NEGATIVE

## 2023-05-27 ENCOUNTER — Ambulatory Visit: Payer: No Typology Code available for payment source | Admitting: Infectious Diseases

## 2023-05-27 ENCOUNTER — Encounter: Payer: Self-pay | Admitting: Infectious Diseases

## 2023-05-27 ENCOUNTER — Telehealth: Payer: Self-pay | Admitting: Infectious Diseases

## 2023-05-27 VITALS — BP 123/82 | HR 73 | Temp 98.0°F | Ht 67.0 in | Wt 194.5 lb

## 2023-05-27 DIAGNOSIS — A630 Anogenital (venereal) warts: Secondary | ICD-10-CM

## 2023-05-27 DIAGNOSIS — B20 Human immunodeficiency virus [HIV] disease: Secondary | ICD-10-CM

## 2023-05-27 DIAGNOSIS — Z79899 Other long term (current) drug therapy: Secondary | ICD-10-CM | POA: Diagnosis not present

## 2023-05-27 DIAGNOSIS — Z113 Encounter for screening for infections with a predominantly sexual mode of transmission: Secondary | ICD-10-CM

## 2023-05-27 LAB — HIV-1 RNA QUANT-NO REFLEX-BLD: HIV-1 RNA Viral Load: <20 copies/mL

## 2023-05-27 LAB — RPR, QUANT+TP ABS (REFLEX): Rapid Plasma Reagin, Quant: 1:1 {titer} — ABNORMAL HIGH

## 2023-05-27 LAB — CBC
Hemoglobin: 15.9 g/dL (ref 13.0–17.7)
MCH: 29.3 pg (ref 26.6–33.0)
MCHC: 34.3 g/dL (ref 31.5–35.7)
MCV: 85 fL (ref 79–97)
WBC: 3.4 10*3/uL (ref 3.4–10.8)

## 2023-05-27 LAB — CMP14 + ANION GAP
AST: 20 IU/L (ref 0–40)
Albumin: 4.4 g/dL (ref 4.1–5.1)
Alkaline Phosphatase: 98 IU/L (ref 44–121)
BUN/Creatinine Ratio: 9 (ref 9–20)
BUN: 11 mg/dL (ref 6–24)
Bilirubin Total: 0.4 mg/dL (ref 0.0–1.2)
CO2: 22 mmol/L (ref 20–29)
Calcium: 9.5 mg/dL (ref 8.7–10.2)
Creatinine, Ser: 1.19 mg/dL (ref 0.76–1.27)
Globulin, Total: 3.2 g/dL (ref 1.5–4.5)
Potassium: 4.4 mmol/L (ref 3.5–5.2)
Sodium: 140 mmol/L (ref 134–144)
Total Protein: 7.6 g/dL (ref 6.0–8.5)
eGFR: 79 mL/min/{1.73_m2} (ref >59)

## 2023-05-27 LAB — LIPID PANEL
Chol/HDL Ratio: 3.3 ratio (ref 0.0–5.0)
Cholesterol, Total: 198 mg/dL (ref 100–199)
HDL: 60 mg/dL (ref >39)
LDL Chol Calc (NIH): 125 mg/dL — ABNORMAL HIGH (ref 0–99)
VLDL Cholesterol Cal: 13 mg/dL (ref 5–40)

## 2023-05-27 LAB — RPR: RPR Ser Ql: REACTIVE — AB

## 2023-05-27 NOTE — Assessment & Plan Note (Signed)
Will see if we can get him on cabaneuva.  He will call back with his last DTap injection.  Offered/refused condoms.  Wants work accomodation to work from home. Going in to office makes him depressed.  Records from [facility] sent for. [facility]? Will see him back in 9 months, sooner if started on cabaneuva.

## 2023-05-27 NOTE — Progress Notes (Signed)
Subjective:    Patient ID: Cole Thomas, male  DOB: 1981/11/11, 42 y.o.        MRN: [id number]   HPI 42 yo M with hx of HIV+ since 2012 and previously on atripla. He has taken ART sparingly until he returned 06-2014 and wanted to be started on ART. He was started on stribild then quit as he did not feel better.  May 2017 genvoya.  Feb 2018 tivicay/Descovey  08-2017 Biktarvy which he did not like so he went back to tivicay/descovy.   He was seen at [facility].  He moved to [place] for work and has now moved back to [place].  He was on cabaneuva briefly in [place] until his ins ran out.    He also was screened for prostate Ca while in [place]. He states this came back fine (had fhx- dad).  He would like colon as well. States he had 10 yr ago for rectal bleeding.   Has been doing ok, overwhelming. Too many things happening at once.  No urine issues.  Has GI eval 06-10-23 for consideration of f/u colon.  Getting ready to [facility] for vacation. Has started working out with a Psychologist, educational. Also working on nutrition.   Lab Results  Component Value Date   CHOL 198 05/23/2023   HDL 60 05/23/2023   LDLCALC 125 (H) 05/23/2023   TRIG 72 05/23/2023   CHOLHDL 3.3 05/23/2023      HIV 1 RNA Quant (copies/mL)  Date Value  07/17/2017 <20 DETECTED (A)  04/08/2017 <20 DETECTED (A)  01/20/2017 78,100 (H)   HIV-1 RNA Viral Load  Date Value  05/23/2023 <20 copies/mL  02/11/2013 50,000   CD4 T Cell Abs (/uL)  Date Value  05/23/2023 316 (L)  07/17/2017 320 (L)  04/08/2017 320 (L)     Health Maintenance  Topic Date Due   COVID-19 Vaccine (1) Never done   DTaP/Tdap/Td (2 - Td or Tdap) 02/27/2020   INFLUENZA VACCINE  06/26/2023   Hepatitis C Screening  Completed   HIV Screening  Completed   HPV VACCINES  Aged Out      Review of Systems  Constitutional:  Negative for chills, fever and weight loss.  HENT:  Negative for congestion and sore throat.   Respiratory:  Positive for cough. Negative for  sputum production and shortness of breath.   Cardiovascular:  Negative for chest pain.  Gastrointestinal:  Negative for constipation and diarrhea.  Genitourinary:  Negative for dysuria, frequency and urgency.    Please see HPI. All other systems reviewed and negative.     Objective:  Physical Exam Vitals reviewed.  Constitutional:      Appearance: Normal appearance. He is normal weight.  HENT:     Mouth/Throat:     Mouth: Mucous membranes are moist.     Pharynx: Oropharynx is clear.  Cardiovascular:     Rate and Rhythm: Normal rate and regular rhythm.  Pulmonary:     Effort: Pulmonary effort is normal.     Breath sounds: Normal breath sounds.  Abdominal:     General: Abdomen is flat. Bowel sounds are normal.     Palpations: Abdomen is soft.  Musculoskeletal:        General: Normal range of motion.     Cervical back: Normal range of motion and neck supple.     Right lower leg: No edema.     Left lower leg: No edema.  Neurological:     General: No focal deficit  present.     Mental Status: He is alert.  Psychiatric:        Mood and Affect: Mood normal.            Assessment & Plan:

## 2023-05-27 NOTE — Telephone Encounter (Signed)
Called pt to fill out accomodation form.  Will call back tomorrow.

## 2023-05-27 NOTE — Assessment & Plan Note (Signed)
Denies recurrences Will be seen by GI.

## 2023-05-28 ENCOUNTER — Other Ambulatory Visit (HOSPITAL_COMMUNITY): Payer: Self-pay

## 2023-05-28 ENCOUNTER — Telehealth: Payer: Self-pay | Admitting: Infectious Diseases

## 2023-05-28 NOTE — Telephone Encounter (Signed)
Called pt and we discussed his accommodation sheet and filled in together.

## 2023-06-10 ENCOUNTER — Encounter: Payer: Self-pay | Admitting: Gastroenterology

## 2023-06-10 ENCOUNTER — Ambulatory Visit: Payer: No Typology Code available for payment source | Admitting: Gastroenterology

## 2023-06-10 VITALS — BP 118/80 | HR 68 | Ht 67.0 in | Wt 195.0 lb

## 2023-06-10 DIAGNOSIS — K625 Hemorrhage of anus and rectum: Secondary | ICD-10-CM | POA: Diagnosis not present

## 2023-06-10 MED ORDER — NA SULFATE-K SULFATE-MG SULF 17.5-3.13-1.6 GM/177ML PO SOLN: 1.0000 | Freq: Once | ORAL | 0 refills | Status: AC

## 2023-06-10 NOTE — Patient Instructions (Signed)
_______________________________________________________  If your blood pressure at your visit was 140/90 or greater, please contact your primary care physician to follow up on this.  _______________________________________________________  If you are age 40 or older, your body mass index should be between 23-30. Your Body mass index is 30.54 kg/m. If this is out of the aforementioned range listed, please consider follow up with your Primary Care Provider.  If you are age 26 or younger, your body mass index should be between 19-25. Your Body mass index is 30.54 kg/m. If this is out of the aformentioned range listed, please consider follow up with your Primary Care Provider.   ________________________________________________________  The [facility] providers would like to encourage you to use [facility] to communicate with providers for non-urgent requests or questions.  Due to long hold times on the telephone, sending your provider a message by [facility] may be a faster and more efficient way to get a response.  Please allow 48 business hours for a response.  Please remember that this is for non-urgent requests.  _______________________________________________________  Bonita Quin have been scheduled for a colonoscopy. Please follow written instructions given to you at your visit today.   Please pick up your prep supplies at the pharmacy within the next 1-3 days.  If you use inhalers (even only as needed), please bring them with you on the day of your procedure.  DO NOT TAKE 7 DAYS PRIOR TO TEST- Trulicity (dulaglutide) Ozempic, Wegovy (semaglutide) Mounjaro (tirzepatide) Bydureon Bcise (exanatide extended release)  DO NOT TAKE 1 DAY PRIOR TO YOUR TEST Rybelsus (semaglutide) Adlyxin (lixisenatide) Victoza (liraglutide) Byetta (exanatide) ___________________________________________________________________________  Due to recent changes in healthcare laws, you may see the results of your imaging  and laboratory studies on MyChart before your provider has had a chance to review them.  We understand that in some cases there may be results that are confusing or concerning to you. Not all laboratory results come back in the same time frame and the provider may be waiting for multiple results in order to interpret others.  Please give [place] 48 hours in order for your provider to thoroughly review all the results before contacting the office for clarification of your results.   It was a pleasure to see you today!  Thank you for trusting me with your gastrointestinal care!

## 2023-06-10 NOTE — Progress Notes (Addendum)
Cole Thomas:  History: Cole Thomas 06/10/2023  Referring provider: [name], MD  Reason for consult/chief complaint: Colonoscopy (Pt states he would like to discuss a colon.)   Subjective  HPI: This is a very pleasant 42 year old male referred by medical clinic with concerns of anorectal bleeding.  [place] had some rectal bleeding underwent a colonoscopy, after which she was told everything was "fine".  His bowel habits are regular and daily and of normal character.  Intermittent nausea, no vomiting or dysphagia.  He is purposely losing some weight lately through diet and exercise.  Valerian intermittently has blood that he might see on the stool or on the paper, and that is his main concern today that he would like evaluated.  His concern is partially related to a sister with ulcerative colitis and also his personal history of receptive anal intercourse.  Presently he does not have anorectal pain.   ROS:  Remainder of systems are negative except as noted above.   Past Medical History: Past Medical History:  Diagnosis Date   HIV infection (HCC)    Syphilis in male    From medical clinic office visit 03/25/2023 (the day of this referral): Expand All Collapse All    Subjective:     Patient ID: Cole Thomas, male  DOB: Jan 04, 1981, 42 y.o.        MRN: [id number]     HPI 41 yo M with hx of HIV+ since 2012 and previously on atripla. He has taken ART sparingly until he returned 06-2014 and wanted to be started on ART. He was started on stribild then quit as he did not feel better.  May 2017 genvoya.  Feb 2018 tivicay/Descovey  08-2017 Biktarvy which he did not like so he went back to tivicay/descovy.   He was seen at [facility].  He moved to [place] for work and has now moved back to [place].  He was on cabaneuva briefly in [place] until his ins ran out. He went back on orals.  He gained some wt while in [place] and  "got a little bit older"   He also was screened for prostate Ca while in [place]. He states this came back fine (had fhx).  He would like colon as well. States he had 10 yr ago for rectal bleeding.     Past Surgical History: History reviewed. No pertinent surgical history.   Family History: Family History  Problem Relation Age of Onset   Prostate cancer Father    Ulcerative colitis Sister    Liver disease Neg Hx    Esophageal cancer Neg Hx    Colon cancer Neg Hx     Social History: Social History   Socioeconomic History   Marital status: Single    Spouse name: Not on file   Number of children: 0   Years of education: Not on file   Highest education level: Not on file  Occupational History   Occupation: banking  Tobacco Use   Smoking status: Never   Smokeless tobacco: Never  Vaping Use   Vaping status: Not on file  Substance and Sexual Activity   Alcohol use: No    Alcohol/week: 0.0 standard drinks of alcohol   Drug use: No   Sexual activity: Not Currently    Partners: Male    Birth control/protection: Condom    Comment: Anal pap- negative 10-2010, refused condoms  Other Topics Concern  Not on file  Social History Narrative   Not on file   Social Determinants of Health   Financial Resource Strain: Not on file  Food Insecurity: Not on file  Transportation Needs: Not on file  Physical Activity: Not on file  Stress: Not on file  Social Connections: Unknown (03/29/2022)   Received from [facility]   Social Network    Social Network: Not on file    Allergies: Allergies  Allergen Reactions   Atripla [Efavirenz-Emtricitab-Tenofo Df]     Nausea, vomiting and abdominal pain    Outpatient Meds: Current Outpatient Medications  Medication Sig Dispense Refill   dicyclomine (BENTYL) 20 MG tablet Take 1 tablet (20 mg total) by mouth 3 (three) times daily before meals. 90 tablet 0   dolutegravir (TIVICAY) 50 MG tablet TAKE ONE TABLET BY MOUTH ONCE  DAILY. STORE AT ROOM TEMPERATURE. 90 tablet 3   emtricitabine-tenofovir AF (DESCOVY) 200-25 MG tablet TAKE ONE TABLET BY MOUTH ONCE DAILY WITH OR WITHOUT FOOD. 270 tablet 3   fexofenadine (ALLEGRA) 180 MG tablet Take 180 mg by mouth daily. Reported on 04/18/2016     No current facility-administered medications for this visit.      ___________________________________________________________________ Objective   Exam:  BP 118/80   Pulse 68   Ht 5\' 7"  (1.702 m)   Wt 195 lb (88.5 kg)   BMI 30.54 kg/m  Wt Readings from Last 3 Encounters:  06/10/23 195 lb (88.5 kg)  05/27/23 194 lb 8 oz (88.2 kg)  03/18/23 205 lb 1.6 oz (93 kg)    General: Well-appearing Eyes: sclera anicteric, no redness ENT: oral mucosa moist without lesions, no cervical or supraclavicular lymphadenopathy CV: Regular without appreciable murmur, no JVD, no peripheral edema Resp: clear to auscultation bilaterally, normal RR and effort noted GI: soft, no tenderness, with active bowel sounds. No guarding or palpable organomegaly noted. Skin; warm and dry, no rash or jaundice noted Neuro: awake, alert and oriented x 3. Normal gross motor function and fluent speech Rectal exam was deferred-colonoscopy planned Labs:     Latest Ref Rng & Units 05/23/2023    9:13 AM 04/08/2017    2:02 PM 02/09/2017   10:08 PM  CBC  WBC 3.4 - 10.8 x10E3/uL 3.4  4.4  8.0   Hemoglobin 13.0 - 17.7 g/dL 40.9  81.1  91.4   Hematocrit 37.5 - 51.0 % 46.3  40.4  37.4   Platelets 150 - 450 x10E3/uL 270  226  241    05/23/2023 -HIV viral load undetectable    Assessment: Encounter Diagnosis  Name Primary?   Anal bleeding Yes    Likely benign, and probably not a fissure without reported pain.  At increased risk of rectal STI and anal cancer, colonoscopy warranted.  He was reassured by that plan and feels he would have peace of mind with things evaluated further.  Procedure described in detail along with risk and benefits and he was  agreeable.   The benefits and risks of the planned procedure were described in detail with the patient or (when appropriate) their health care proxy.  Risks were outlined as including, but not limited to, bleeding, infection, perforation, adverse medication reaction leading to cardiac or pulmonary decompensation, pancreatitis (if ERCP).  The limitation of incomplete mucosal visualization was also discussed.  No guarantees or warranties were given.  Thank you for the courtesy of this consult.  Please call me with any questions or concerns.  Charlie Pitter III  CC:  Referring provider noted above

## 2023-06-16 ENCOUNTER — Telehealth: Payer: Self-pay

## 2023-06-16 ENCOUNTER — Telehealth: Payer: Self-pay | Admitting: Infectious Diseases

## 2023-06-16 NOTE — Telephone Encounter (Signed)
Informed pt that his paperwork has been completed and faxed to [employer] Leave and absence administration @ [phone].

## 2023-06-16 NOTE — Telephone Encounter (Signed)
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   We discussed his RPR and that he does not need further treatment at this time.

## 2023-06-16 NOTE — Telephone Encounter (Signed)
Work  accommodation  paperwork was given to me to fax but  did not have the  fax number was given to hme she stated she will  get the number from pt and fax it  out

## 2023-07-17 ENCOUNTER — Telehealth: Payer: Self-pay

## 2023-07-17 NOTE — Telephone Encounter (Signed)
Per patient, Yvonne Kendall is requesting additional information on his work accommodation and requesting a letter for this. Pt would like to speak with Dr. Ninetta Lights about this, so he can explained to him. Please call pt back.

## 2023-07-22 ENCOUNTER — Encounter: Payer: Self-pay | Admitting: Gastroenterology

## 2023-07-22 NOTE — Telephone Encounter (Signed)
Having worsening anxiety on walking to building, sweating, and feels like he is in danger. Has signs/sx consistent with panic attack.  Into "more details" section.

## 2023-07-23 ENCOUNTER — Telehealth: Payer: Self-pay

## 2023-07-23 NOTE — Telephone Encounter (Signed)
Called pt-no answer and unable LVM. Forms for work accommodation has been completed and faxed 07/23/2023. Forms will be scanned into pt chart.

## 2023-08-04 ENCOUNTER — Encounter: Payer: Self-pay | Admitting: Gastroenterology

## 2023-08-04 ENCOUNTER — Ambulatory Visit: Payer: No Typology Code available for payment source | Admitting: Gastroenterology

## 2023-08-04 VITALS — BP 123/81 | HR 73 | Temp 98.0°F | Resp 16 | Ht 67.0 in | Wt 195.0 lb

## 2023-08-04 DIAGNOSIS — K629 Disease of anus and rectum, unspecified: Secondary | ICD-10-CM | POA: Diagnosis not present

## 2023-08-04 DIAGNOSIS — K625 Hemorrhage of anus and rectum: Secondary | ICD-10-CM | POA: Diagnosis not present

## 2023-08-04 DIAGNOSIS — K6282 Dysplasia of anus: Secondary | ICD-10-CM | POA: Insufficient documentation

## 2023-08-04 MED ORDER — SODIUM CHLORIDE 0.9 % IV SOLN: 500.0000 mL | Freq: Once | INTRAVENOUS | Status: DC

## 2023-08-04 NOTE — Progress Notes (Signed)
Called to room to assist during endoscopic procedure.  Patient ID and intended procedure confirmed with present staff. Received instructions for my participation in the procedure from the performing physician.  

## 2023-08-04 NOTE — Progress Notes (Signed)
History and Physical:  This patient presents for endoscopic testing for: Encounter Diagnosis  Name Primary?   Anal bleeding Yes    Clinical details in 06/10/23 office consult note, and the patient reports no changes since then. Episodic anal bleeding  Patient is otherwise without complaints or active issues today.   Past Medical History: Past Medical History:  Diagnosis Date   Allergy    HIV infection (HCC)    Syphilis in male      Past Surgical History: History reviewed. No pertinent surgical history.  Allergies: Allergies  Allergen Reactions   Atripla [Efavirenz-Emtricitab-Tenofo Df]     Nausea, vomiting and abdominal pain    Outpatient Meds: Current Outpatient Medications  Medication Sig Dispense Refill   dolutegravir (TIVICAY) 50 MG tablet TAKE ONE TABLET BY MOUTH ONCE DAILY. STORE AT ROOM TEMPERATURE. 90 tablet 3   emtricitabine-tenofovir AF (DESCOVY) 200-25 MG tablet TAKE ONE TABLET BY MOUTH ONCE DAILY WITH OR WITHOUT FOOD. 270 tablet 3   dicyclomine (BENTYL) 20 MG tablet Take 1 tablet (20 mg total) by mouth 3 (three) times daily before meals. 90 tablet 0   fexofenadine (ALLEGRA) 180 MG tablet Take 180 mg by mouth daily. Reported on 04/18/2016     Current Facility-Administered Medications  Medication Dose Route Frequency Provider Last Rate Last Admin   0.9 %  sodium chloride infusion  500 mL Intravenous Once Sherrilyn Rist, MD          ___________________________________________________________________ Objective   Exam:  BP (!) 142/98   Pulse 81   Temp 98 F (36.7 C)   Ht 5\' 7"  (1.702 m)   Wt 195 lb (88.5 kg)   SpO2 100%   BMI 30.54 kg/m   CV: regular , S1/S2 Resp: clear to auscultation bilaterally, normal RR and effort noted GI: soft, no tenderness, with active bowel sounds.   Assessment: Encounter Diagnosis  Name Primary?   Anal bleeding Yes     Plan: Colonoscopy   The benefits and risks of the planned procedure were described in  detail with the patient or (when appropriate) their health care proxy.  Risks were outlined as including, but not limited to, bleeding, infection, perforation, adverse medication reaction leading to cardiac or pulmonary decompensation, pancreatitis (if ERCP).  The limitation of incomplete mucosal visualization was also discussed.  No guarantees or warranties were given.  The patient is appropriate for an endoscopic procedure in the ambulatory setting.   - Amada Jupiter, MD

## 2023-08-04 NOTE — Patient Instructions (Addendum)
Resume previous diet Continue present medications Await pathology results There were no colon polyps seen today!  You will need another screening colonoscopy in 10 years, you will receive a letter at that time when you are due for the procedure.   Please call us at [phone] if you have a change in bowel habits, change in family history of colo-rectal cancer, rectal bleeding or other GI concern before that time.  YOU HAD AN ENDOSCOPIC PROCEDURE TODAY AT THE [facility]:   Refer to the procedure report that was given to you for any specific questions about what was found during the examination.  If the procedure report does not answer your questions, please call your gastroenterologist to clarify.  If you requested that your care partner not be given the details of your procedure findings, then the procedure report has been included in a sealed envelope for you to review at your convenience later.  YOU SHOULD EXPECT: Some feelings of bloating in the abdomen. Passage of more gas than usual.  Walking can help get rid of the air that was put into your GI tract during the procedure and reduce the bloating. If you had a lower endoscopy (such as a colonoscopy or flexible sigmoidoscopy) you may notice spotting of blood in your stool or on the toilet paper. If you underwent a bowel prep for your procedure, you may not have a normal bowel movement for a few days.  Please Note:  You might notice some irritation and congestion in your nose or some drainage.  This is from the oxygen used during your procedure.  There is no need for concern and it should clear up in a day or so.  SYMPTOMS TO REPORT IMMEDIATELY:  Following lower endoscopy (colonoscopy):  Excessive amounts of blood in the stool  Significant tenderness or worsening of abdominal pains  Swelling of the abdomen that is new, acute  Fever of 100F or higher  For urgent or emergent issues, a gastroenterologist can be reached at any  hour by calling [phone]. Do not use MyChart messaging for urgent concerns.   DIET:  We do recommend a small meal at first, but then you may proceed to your regular diet.  Drink plenty of fluids but you should avoid alcoholic beverages for 24 hours.  ACTIVITY:  You should plan to take it easy for the rest of today and you should NOT DRIVE or use heavy machinery until tomorrow (because of the sedation medicines used during the test).    FOLLOW UP: Our staff will call the number listed on your records the next business day following your procedure.  We will call around 7:15- 8:00 am to check on you and address any questions or concerns that you may have regarding the information given to you following your procedure. If we do not reach you, we will leave a message.     If any biopsies were taken you will be contacted by phone or by letter within the next 1-3 weeks.  Please call us at [phone] if you have not heard about the biopsies in 3 weeks.   SIGNATURES/CONFIDENTIALITY: You and/or your care partner have signed paperwork which will be entered into your electronic medical record.  These signatures attest to the fact that that the information above on your After Visit Summary has been reviewed and is understood.  Full responsibility of the confidentiality of this discharge information lies with you and/or your care-partner.

## 2023-08-04 NOTE — Progress Notes (Signed)
Vss nad trans to pacu 

## 2023-08-04 NOTE — Op Note (Signed)
[facility] Patient Name: Cole Thomas Procedure Date: 08/04/2023 9:47 AM MRN: [id number] Endoscopist: Sherilyn Cooter L. Myrtie Neither , MD, [id number] Age: 42 Referring MD:  Date of Birth: 1980-12-03 Gender: Male Account #: [id number] Procedure:                Colonoscopy Indications:              Anal bleeding                           see recent office consult note for clinical details Medicines:                Monitored Anesthesia Care Procedure:                Pre-Anesthesia Assessment:                           - Prior to the procedure, a History and Physical                            was performed, and patient medications and                            allergies were reviewed. The patient's tolerance of                            previous anesthesia was also reviewed. The risks                            and benefits of the procedure and the sedation                            options and risks were discussed with the patient.                            All questions were answered, and informed consent                            was obtained. Prior Anticoagulants: The patient has                            taken no anticoagulant or antiplatelet agents. ASA                            Grade Assessment: II - A patient with mild systemic                            disease. After reviewing the risks and benefits,                            the patient was deemed in satisfactory condition to                            undergo the procedure.  After obtaining informed consent, the colonoscope                            was passed under direct vision. Throughout the                            procedure, the patient's blood pressure, pulse, and                            oxygen saturations were monitored continuously. The                            Olympus CF-HQ190L ([id number]) Colonoscope was                            introduced through the anus and advanced to the  the                            terminal ileum, with identification of the                            appendiceal orifice and IC valve. The colonoscopy                            was performed without difficulty. The patient                            tolerated the procedure well. The quality of the                            bowel preparation was excellent. The terminal                            ileum, ileocecal valve, appendiceal orifice, and                            rectum were photographed. Scope In: 10:04:38 AM Scope Out: 10:19:10 AM Scope Withdrawal Time: 0 hours 12 minutes 41 seconds  Total Procedure Duration: 0 hours 14 minutes 32 seconds  Findings:                 The perianal and digital rectal examinations were                            normal.                           The terminal ileum appeared normal.                           There is no endoscopic evidence of inflammation or                            polyps in the entire colon.  Small, flat to slightly-raised patch of mucosa with                            altered pit pattern in posterior anal canal (below                            dentate line). Biopsies were taken with a cold                            forceps for histology.                           The exam was otherwise without abnormality on                            direct and retroflexion views. Complications:            No immediate complications. Estimated Blood Loss:     Estimated blood loss was minimal. Impression:               - The examined portion of the ileum was normal.                           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                           - The examination was otherwise normal on direct                            and retroflexion views.                           Benign episodic anal bleeding. Recommendation:           - Patient has a contact number available for                             emergencies. The signs and symptoms of potential                            delayed complications were discussed with the                            patient. Return to normal activities tomorrow.                            Written discharge instructions were provided to the                            patient.                           - Resume previous diet.                           - Continue present medications.                           -  Await pathology results. If AIN confirmed, refer                            to colorectal surgery for surveillance.                           - Repeat colonoscopy in 10 years for screening                            purposes. Caelen Higinbotham L. Myrtie Neither, MD 08/04/2023 10:26:18 AM This report has been signed electronically.

## 2023-08-05 ENCOUNTER — Telehealth: Payer: Self-pay | Admitting: *Deleted

## 2023-08-05 NOTE — Telephone Encounter (Signed)
  Follow up Call-     08/04/2023    9:44 AM  Call back number  Post procedure Call Back phone  # [phone]  Permission to leave phone message Yes     Patient questions:  Do you have a fever, pain , or abdominal swelling? No. Pain Score  0 *  Have you tolerated food without any problems? Yes.    Have you been able to return to your normal activities? Yes.    Do you have any questions about your discharge instructions: Diet   No. Medications  No. Follow up visit  No.  Do you have questions or concerns about your Care? No.  Actions: * If pain score is 4 or above: No action needed, pain <4.

## 2023-08-06 ENCOUNTER — Telehealth: Payer: Self-pay

## 2023-08-06 NOTE — Telephone Encounter (Signed)
Per patient Cole Thomas is requesting a letter for additional information on work accommodation. 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 Please call pt back.

## 2023-08-06 NOTE — Telephone Encounter (Signed)
RTC to patient needs to have letter stating specific reason why he needs to wok from home.  Can address To Whom It May Concern.

## 2023-08-06 NOTE — Telephone Encounter (Signed)
Ok. I see where Dr. Ninetta Lights completed a form for this in August. I think this will have to wait for Dr. Ninetta Lights to return to the office because he has more context about the situation.

## 2023-08-07 ENCOUNTER — Telehealth: Payer: Self-pay | Admitting: Infectious Diseases

## 2023-08-07 ENCOUNTER — Encounter: Payer: Self-pay | Admitting: Infectious Diseases

## 2023-08-07 LAB — SURGICAL PATHOLOGY

## 2023-08-07 NOTE — Telephone Encounter (Signed)
Called pt to discuss what remains incomplete in his work note.  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  An office space which is not at home increases his distraction and anxiety and decreases his productivity.

## 2023-08-08 ENCOUNTER — Encounter: Payer: Self-pay | Admitting: Gastroenterology

## 2023-08-12 ENCOUNTER — Encounter: Payer: Self-pay | Admitting: Infectious Diseases

## 2024-02-24 ENCOUNTER — Other Ambulatory Visit (HOSPITAL_COMMUNITY)
Admission: RE | Admit: 2024-02-24 | Discharge: 2024-02-24 | Disposition: A | Discharge status: Home / Self Care | Source: Ambulatory Visit | Attending: Infectious Diseases | Admitting: Infectious Diseases

## 2024-02-24 ENCOUNTER — Telehealth: Payer: Self-pay

## 2024-02-24 ENCOUNTER — Other Ambulatory Visit: Payer: Self-pay | Admitting: *Deleted

## 2024-02-24 ENCOUNTER — Other Ambulatory Visit: Payer: Self-pay

## 2024-02-24 ENCOUNTER — Ambulatory Visit (INDEPENDENT_AMBULATORY_CARE_PROVIDER_SITE_OTHER): Admitting: Infectious Diseases

## 2024-02-24 VITALS — BP 125/91 | HR 82 | Temp 98.1°F | Ht 67.0 in | Wt 200.5 lb

## 2024-02-24 DIAGNOSIS — Z113 Encounter for screening for infections with a predominantly sexual mode of transmission: Secondary | ICD-10-CM | POA: Diagnosis present

## 2024-02-24 DIAGNOSIS — J029 Acute pharyngitis, unspecified: Secondary | ICD-10-CM

## 2024-02-24 DIAGNOSIS — B20 Human immunodeficiency virus [HIV] disease: Secondary | ICD-10-CM | POA: Diagnosis not present

## 2024-02-24 DIAGNOSIS — Z79899 Other long term (current) drug therapy: Secondary | ICD-10-CM | POA: Diagnosis not present

## 2024-02-24 DIAGNOSIS — K6282 Dysplasia of anus: Secondary | ICD-10-CM | POA: Diagnosis not present

## 2024-02-24 MED ORDER — DESCOVY 200-25 MG PO TABS: ORAL_TABLET | ORAL | 3 refills | Status: DC

## 2024-02-24 MED ORDER — TIVICAY 50 MG PO TABS: ORAL_TABLET | ORAL | 3 refills | Status: AC

## 2024-02-24 MED ORDER — AZITHROMYCIN 250 MG PO TABS: ORAL_TABLET | ORAL | 0 refills | Status: AC

## 2024-02-24 NOTE — Addendum Note (Signed)
 Addended by: Maytal Mijangos C on: 02/24/2024 02:15 PM   Modules accepted: Orders

## 2024-02-24 NOTE — Assessment & Plan Note (Signed)
 DTGV-Descovey refilled Will check with Durward Mallard about cabaneuva given condoms Await his labs done today.  Rtc in 1 month

## 2024-02-24 NOTE — Telephone Encounter (Signed)
 camille- can he get cabaneuva? he has had it prior? does he qualify for ADAP or will his mediciad cover his rx? Thanks       Message from Dr Ninetta Lights  in case the secure chat deletes

## 2024-02-24 NOTE — Assessment & Plan Note (Addendum)
 Does not want steroid inhaler Sent in z-pack Continue with OTC anti-histamine.  Check pharyngeal (+anal) gc/chlamydia Rtc in 1 month.

## 2024-02-24 NOTE — Addendum Note (Signed)
 Addended by: Hope Holst C on: 02/24/2024 02:18 PM   Modules accepted: Orders

## 2024-02-24 NOTE — Assessment & Plan Note (Signed)
 Has f/u this month with Dr Maisie Fus.  Appreciate her f/u.

## 2024-02-24 NOTE — Progress Notes (Signed)
 Subjective:    Patient ID: Cole Thomas, male  DOB: 09/17/1981, 43 y.o.        MRN: [id number]   HPI 43 yo M with hx of HIV+ since 2012 and previously on atripla. He has taken ART sparingly until he returned 06-2014 and wanted to be started on ART. He was started on stribild then quit as he did not feel better.  May 2017 genvoya.  Feb 2018 tivicay/Descovey  08-2017 Biktarvy which he did not like so he went back to tivicay/descovy.   He was seen at [facility].  He moved to [place] for work and has now moved back to [place].  He was on cabaneuva briefly in [place] until his ins ran out. He went back on orals.    He also was screened for prostate Ca while in [place]. He states this came back fine (had fhx).  He would like colon as well. States he had 10 yr ago for rectal bleeding.   He has had swollen LN in his neck and sore throat for last 3 days. Has tried OTC anti-histamine.  Has noticed sinus drainage.   Had colonoscopy and was sent to Dr Maisie Fus for wart. He had cytology sent Has been out of work, and off meds for last for ~1 month. Was not able to get back on cabaneuva.      HIV 1 RNA Quant (copies/mL)  Date Value  07/17/2017 <20 DETECTED (A)  04/08/2017 <20 DETECTED (A)  01/20/2017 78,100 (H)   HIV-1 RNA Viral Load  Date Value  05/23/2023 <20 copies/mL  02/11/2013 50,000   CD4 T Cell Abs (/uL)  Date Value  05/23/2023 316 (L)  07/17/2017 320 (L)  04/08/2017 320 (L)     Health Maintenance  Topic Date Due   COVID-19 Vaccine (1) Never done   Pneumococcal Vaccine 32-44 Years old (3 of 3 - PCV) 02/27/2011   DTaP/Tdap/Td (2 - Td or Tdap) 02/27/2020   INFLUENZA VACCINE  06/25/2024   Hepatitis C Screening  Completed   HIV Screening  Completed   HPV VACCINES  Aged Out      Review of Systems  Constitutional:  Negative for chills and fever.  HENT:  Positive for sore throat.   Respiratory:  Positive for cough. Negative for shortness of breath.   Gastrointestinal:  Negative  for constipation and diarrhea.  Genitourinary:  Negative for dysuria.  Neurological:  Positive for headaches (1 week ago, relieved with OTC).    Please see HPI. All other systems reviewed and negative.     Objective:  Physical Exam Vitals reviewed.  Constitutional:      General: He is not in acute distress.    Appearance: He is normal weight. He is not toxic-appearing.  HENT:     Mouth/Throat:     Mouth: Mucous membranes are moist. No oral lesions.     Pharynx: Oropharynx is clear. Posterior oropharyngeal erythema present. No oropharyngeal exudate.     Tonsils: No tonsillar exudate or tonsillar abscesses. 1+ on the right. 1+ on the left.  Cardiovascular:     Rate and Rhythm: Normal rate and regular rhythm.  Pulmonary:     Effort: Pulmonary effort is normal.     Breath sounds: Normal breath sounds.  Abdominal:     General: Bowel sounds are normal. There is no distension.     Palpations: Abdomen is soft.     Tenderness: There is no abdominal tenderness.  Skin:    Findings: No  erythema.  Neurological:     General: No focal deficit present.     Mental Status: He is alert.  Psychiatric:        Mood and Affect: Mood normal.            Assessment & Plan:

## 2024-02-24 NOTE — Addendum Note (Signed)
 Addended by: Bufford Spikes on: 02/24/2024 02:23 PM   Modules accepted: Orders

## 2024-02-25 ENCOUNTER — Other Ambulatory Visit (HOSPITAL_COMMUNITY): Payer: Self-pay

## 2024-02-25 LAB — URINE CYTOLOGY ANCILLARY ONLY
Chlamydia: NEGATIVE
Comment: NEGATIVE
Comment: NORMAL
Neisseria Gonorrhea: NEGATIVE

## 2024-02-25 LAB — CYTOLOGY, (ORAL, ANAL, URETHRAL) ANCILLARY ONLY
Chlamydia: NEGATIVE
Chlamydia: NEGATIVE
Comment: NEGATIVE
Comment: NEGATIVE
Comment: NORMAL
Comment: NORMAL
Neisseria Gonorrhea: NEGATIVE
Neisseria Gonorrhea: NEGATIVE

## 2024-02-25 LAB — T-HELPER CELL (CD4) - (RCID CLINIC ONLY)
CD4 % Helper T Cell: 10 % — ABNORMAL LOW (ref 33–65)
CD4 T Cell Abs: 226 /uL — ABNORMAL LOW (ref 400–1790)

## 2024-02-26 LAB — COMPREHENSIVE METABOLIC PANEL WITH GFR
ALT: 38 IU/L (ref 0–44)
AST: 26 IU/L (ref 0–40)
Albumin: 4.5 g/dL (ref 4.1–5.1)
Alkaline Phosphatase: 92 IU/L (ref 44–121)
BUN/Creatinine Ratio: 12 (ref 9–20)
BUN: 11 mg/dL (ref 6–24)
Bilirubin Total: 0.3 mg/dL (ref 0.0–1.2)
CO2: 22 mmol/L (ref 20–29)
Calcium: 9.4 mg/dL (ref 8.7–10.2)
Chloride: 104 mmol/L (ref 96–106)
Creatinine, Ser: 0.91 mg/dL (ref 0.76–1.27)
Globulin, Total: 3.5 g/dL (ref 1.5–4.5)
Glucose: 80 mg/dL (ref 70–99)
Potassium: 4.5 mmol/L (ref 3.5–5.2)
Sodium: 139 mmol/L (ref 134–144)
Total Protein: 8 g/dL (ref 6.0–8.5)
eGFR: 109 mL/min/{1.73_m2} (ref >59)

## 2024-02-26 LAB — CBC
Hematocrit: 47.4 % (ref 37.5–51.0)
Hemoglobin: 15.8 g/dL (ref 13.0–17.7)
MCH: 28.3 pg (ref 26.6–33.0)
MCHC: 33.3 g/dL (ref 31.5–35.7)
MCV: 85 fL (ref 79–97)
Platelets: 236 10*3/uL (ref 150–450)
RBC: 5.59 x10E6/uL (ref 4.14–5.80)
RDW: 12.7 % (ref 11.6–15.4)
WBC: 4.4 10*3/uL (ref 3.4–10.8)

## 2024-02-26 LAB — LIPID PANEL
Chol/HDL Ratio: 3.1 ratio (ref 0.0–5.0)
Cholesterol, Total: 208 mg/dL — ABNORMAL HIGH (ref 100–199)
HDL: 68 mg/dL (ref >39)
LDL Chol Calc (NIH): 120 mg/dL — ABNORMAL HIGH (ref 0–99)
Triglycerides: 115 mg/dL (ref 0–149)
VLDL Cholesterol Cal: 20 mg/dL (ref 5–40)

## 2024-02-26 LAB — HIV-1 RNA QUANT-NO REFLEX-BLD
HIV-1 RNA Viral Load Log: 4.384 {Log_copies}/mL
HIV-1 RNA Viral Load: 24200 {copies}/mL

## 2024-02-26 LAB — RPR

## 2024-03-03 ENCOUNTER — Other Ambulatory Visit: Payer: Self-pay | Admitting: Infectious Diseases

## 2024-03-03 ENCOUNTER — Telehealth: Payer: Self-pay | Admitting: *Deleted

## 2024-03-03 DIAGNOSIS — B20 Human immunodeficiency virus [HIV] disease: Secondary | ICD-10-CM

## 2024-03-03 MED ORDER — CABOTEGRAVIR & RILPIVIRINE ER 600 & 900 MG/3ML IM SUER
INTRAMUSCULAR | 1 refills | Status: DC
  Filled 2024-03-04: qty 48, fill #0
  Filled 2024-03-04: qty 6, 30d supply, fill #0
  Filled 2024-04-22: qty 6, 30d supply, fill #1
  Filled 2024-06-07: qty 6, 56d supply, fill #2
  Filled 2024-08-10: qty 6, 56d supply, fill #3

## 2024-03-03 NOTE — Telephone Encounter (Signed)
 Call to patient informed him that his Cole Thomas  has been ordered .  He will receive a cal when it has arrived to set up an appointment in the Clinics.  He will also take his last oral medication on the day of the appointment. Patient voiced understanding of the plan.  Request that appointments be on Fridays. Informed patient we will be only abl e to schedule Friday am appointments as the Clinics are closed on Friday afternoons. Voiced understanding of the plan.

## 2024-03-04 ENCOUNTER — Other Ambulatory Visit (HOSPITAL_COMMUNITY): Payer: Self-pay

## 2024-03-04 ENCOUNTER — Other Ambulatory Visit: Payer: Self-pay

## 2024-03-04 NOTE — Progress Notes (Signed)
 Pharmacy Patient Advocate Encounter  Insurance verification completed.   The patient is insured through [facility]   Ran test claim for [employer]. Currently a quantity of 1 kit is a 30 day supply and the co-pay is $0.   This test claim was processed through [facility]- copay amounts may vary at other pharmacies due to pharmacy/plan contracts, or as the patient moves through the different stages of their insurance plan.

## 2024-03-04 NOTE — Progress Notes (Signed)
 Specialty Pharmacy Initial Fill Coordination Note  Cole Thomas is a 43 y.o. male contacted today regarding initial fill of specialty medication(s) Cabotegravir & Rilpivirine (CABENUVA)   Patient requested Courier to Provider Office   Delivery date: 03/08/24   Verified address: [facility]   Medication will be filled on 4/11.   Patient is aware of $0 copayment.

## 2024-03-09 NOTE — Telephone Encounter (Signed)
 We have received Cabenuva. I called pt - stated he prefers Fridays. Appt schedule 4/25 (our office is closed on 4/18) @ 1000 AM. Pt is aware to continue oral meds.

## 2024-03-19 ENCOUNTER — Ambulatory Visit: Admitting: *Deleted

## 2024-03-19 DIAGNOSIS — B20 Human immunodeficiency virus [HIV] disease: Secondary | ICD-10-CM

## 2024-03-19 MED ORDER — CABOTEGRAVIR & RILPIVIRINE ER 600 & 900 MG/3ML IM SUER
1.0000 | Freq: Once | INTRAMUSCULAR | Status: AC
Start: 2024-03-19 — End: 2024-03-19
  Administered 2024-03-19: 1 via INTRAMUSCULAR

## 2024-03-19 NOTE — Progress Notes (Signed)
    Patient presents for Cabenuva  injections   States feeling well - yes   Target date is 25 th  of each month   Last injection received : This is pt's first injection.   Patient is within 7 day window before or after target date for injections   Unable to use VG sites 2/2 body habitus   Rilpivirine  900 mg Given IM RUOQ. Patient tolerated well.   Cabotegravir  600 given IM LUOQ. Patient tolerated well.   Patient waited 10 minutes in clinic following injections without incident.  Next injections are due - in 1 month 5/25; pt is aware. We will call pt when next shipment arrives to the office.   Saylor Sheckler,RN

## 2024-03-24 ENCOUNTER — Other Ambulatory Visit (HOSPITAL_COMMUNITY): Payer: Self-pay

## 2024-03-30 ENCOUNTER — Encounter: Admitting: Infectious Diseases

## 2024-04-14 ENCOUNTER — Encounter: Admitting: Infectious Diseases

## 2024-04-22 ENCOUNTER — Other Ambulatory Visit: Payer: Self-pay

## 2024-04-22 ENCOUNTER — Telehealth: Payer: Self-pay | Admitting: *Deleted

## 2024-04-22 NOTE — Telephone Encounter (Signed)
 Pt is due for his 1 month Cabenuva . I called [facility] - talked to [place]. She stated will have the courier bring med by 10 AM tomorrow; if it's not here,  to call her back. I talked to pt who stated he can come any time tomorrow.

## 2024-04-22 NOTE — Progress Notes (Signed)
 Specialty Pharmacy Refill Coordination Note  Cole Thomas is a 43 y.o. male assessed today regarding refills of clinic administered specialty medication(s) Cabotegravir  & Rilpivirine  (CABENUVA )   Spoke with nurse from [facility])  Clinic requested Courier to Provider Office   Delivery date: 04/23/24   Verified address: [facility] [address] [address]   Medication will be filled on 05.29.25.

## 2024-04-23 ENCOUNTER — Ambulatory Visit: Admitting: *Deleted

## 2024-04-23 ENCOUNTER — Other Ambulatory Visit: Payer: Self-pay

## 2024-04-23 DIAGNOSIS — B20 Human immunodeficiency virus [HIV] disease: Secondary | ICD-10-CM | POA: Diagnosis not present

## 2024-04-23 MED ORDER — CABOTEGRAVIR & RILPIVIRINE ER 600 & 900 MG/3ML IM SUER
1.0000 | Freq: Once | INTRAMUSCULAR | Status: AC
  Administered 2024-04-23: 1 via INTRAMUSCULAR

## 2024-04-23 NOTE — Progress Notes (Signed)
    Patient presents for Cabenuva  injections   States feeling well - yes.   Target date is 25th of each month   Last injection received 03/19/24.   Patient is within 7 day window before or after target date for injections   Unable to use VG sites 2/2 body habitus   Rilpivirine  900 mg Given IM RUOQ. Patient tolerated well.   Cabotegravir  600 given IM LUOQ. Patient tolerated well.   Patient waited 10 minutes in clinic following injections without incident.  Next injections are due 06/18/24.   Jamylah Marinaccio,RN

## 2024-05-11 ENCOUNTER — Encounter: Admitting: Infectious Diseases

## 2024-05-12 ENCOUNTER — Ambulatory Visit (INDEPENDENT_AMBULATORY_CARE_PROVIDER_SITE_OTHER): Admitting: Infectious Diseases

## 2024-05-12 VITALS — BP 134/82 | HR 90 | Temp 97.8°F | Ht 67.0 in | Wt 194.8 lb

## 2024-05-12 DIAGNOSIS — B20 Human immunodeficiency virus [HIV] disease: Secondary | ICD-10-CM | POA: Diagnosis not present

## 2024-05-12 DIAGNOSIS — Z79899 Other long term (current) drug therapy: Secondary | ICD-10-CM

## 2024-05-12 DIAGNOSIS — Z113 Encounter for screening for infections with a predominantly sexual mode of transmission: Secondary | ICD-10-CM

## 2024-05-12 DIAGNOSIS — N644 Mastodynia: Secondary | ICD-10-CM | POA: Diagnosis not present

## 2024-05-12 NOTE — Addendum Note (Signed)
 Addended by: Manfred Seed on: 05/12/2024 04:17 PM   Modules accepted: Orders

## 2024-05-12 NOTE — Assessment & Plan Note (Signed)
 Needs PCV 20 He's going to check with his [place] provider Doing well Spoke about timing of cabaneuva. He will work around. Repeat HIV RNA and geno today Rtc in 9 months.

## 2024-05-12 NOTE — Assessment & Plan Note (Signed)
 Nothing on exam Will send for u/s.

## 2024-05-12 NOTE — Progress Notes (Signed)
 Subjective:    Patient ID: Cole Thomas, male  DOB: 10/04/1981, 43 y.o.        MRN: [id number]   HPI 43 yo M with hx of HIV+ since 2012 and previously on atripla. He has taken ART sparingly until he returned 06-2014 and wanted to be started on ART. He was started on stribild then quit as he did not feel better.  May 2017 genvoya.  Feb 2018 tivicay /Descovey  08-2017 Biktarvy which he did not like so he went back to [employer] .   He was seen at [facility].  He moved to [place] for work and has now moved back to [place].  He was on cabaneuva briefly in [place] until his ins ran out.    He also was screened for prostate Ca while in [place]. He states this came back fine (had fhx- dad).  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    Is working out with a Administrator, sports.  On cabaneuva now. 4 injections.  Trying to work out schedule with his new job, orientation.  He may move back to [place].   HIV 1 RNA Quant (copies/mL)  Date Value  07/17/2017 <20 DETECTED (A)  04/08/2017 <20 DETECTED (A)  01/20/2017 78,100 (H)   HIV-1 RNA Viral Load  Date Value  02/24/2024 24,200 copies/mL  05/23/2023 <20 copies/mL  02/11/2013 50,000   CD4 T Cell Abs (/uL)  Date Value  02/24/2024 226 (L)  05/23/2023 316 (L)  07/17/2017 320 (L)     Health Maintenance  Topic Date Due  . COVID-19 Vaccine (1) Never done  . HPV VACCINES (1 - Risk 3-dose SCDM series) Never done  . Pneumococcal Vaccine 49-37 Years old (3 of 3 - PCV) 02/27/2011  . DTaP/Tdap/Td (2 - Td or Tdap) 02/27/2020  . INFLUENZA VACCINE  06/25/2024  . Hepatitis C Screening  Completed  . HIV Screening  Completed  . Meningococcal B Vaccine  Aged Out      Review of Systems  Constitutional:  Negative for chills, fever and weight loss.  Respiratory:  Positive for cough. Negative for shortness of breath.   Gastrointestinal:  Negative for constipation and diarrhea.  Genitourinary:  Negative for dysuria.   Musculoskeletal:  Positive for myalgias.  Psychiatric/Behavioral:  The patient does not have insomnia.     Please see HPI. All other systems reviewed and negative.     Objective:  Physical Exam Vitals reviewed.  Constitutional:      Appearance: Normal appearance.  HENT:     Mouth/Throat:     Mouth: Mucous membranes are moist.     Pharynx: No oropharyngeal exudate.   Eyes:     Extraocular Movements: Extraocular movements intact.     Pupils: Pupils are equal, round, and reactive to light.    Cardiovascular:     Rate and Rhythm: Normal rate and regular rhythm.  Pulmonary:     Effort: Pulmonary effort is normal.     Breath sounds: Normal breath sounds.  Chest:     Chest wall: No deformity, swelling or tenderness.   Abdominal:     General: Abdomen is flat. There is no distension.     Tenderness: There is no abdominal tenderness.   Musculoskeletal:     Cervical back: Normal range of motion and neck supple.     Right lower leg: No edema.     Left lower leg: No edema.   Neurological:  Mental Status: He is alert.           Assessment & Plan:

## 2024-05-14 ENCOUNTER — Other Ambulatory Visit: Payer: Self-pay | Admitting: Infectious Diseases

## 2024-05-14 DIAGNOSIS — N644 Mastodynia: Secondary | ICD-10-CM

## 2024-05-14 LAB — HIV-1 RNA ULTRAQUANT REFLEX TO GENTYP+: HIV1 RNA # SerPl PCR: <20 {copies}/mL

## 2024-05-20 ENCOUNTER — Telehealth: Payer: Self-pay | Admitting: *Deleted

## 2024-05-20 NOTE — Telephone Encounter (Signed)
 Copied from CRM [id number]. Topic: Referral - Question >> May 20, 2024 10:43 AM Mercer PEDLAR wrote: Reason for CRM: Lauren calling from [facility] stating that patient contacted them for scheduling but they do not have the order. They need Diagnostic Bilateral Mammogram order along with the Ultrasound order faxed to  Fax: [phone] Callback: [phone]

## 2024-05-21 ENCOUNTER — Other Ambulatory Visit: Payer: Self-pay | Admitting: Infectious Diseases

## 2024-05-21 DIAGNOSIS — N644 Mastodynia: Secondary | ICD-10-CM

## 2024-06-04 LAB — HM MAMMOGRAPHY

## 2024-06-07 ENCOUNTER — Other Ambulatory Visit: Payer: Self-pay

## 2024-06-15 ENCOUNTER — Other Ambulatory Visit: Payer: Self-pay

## 2024-06-15 NOTE — Progress Notes (Signed)
 Specialty Pharmacy Refill Coordination Note  Cole Thomas is a 43 y.o. male assessed today regarding refills of clinic administered specialty medication(s) Cabotegravir  & Rilpivirine  (CABENUVA )   Clinic requested Courier to Provider Office   Delivery date: 06/16/24   Verified address: [facility] [address] [address]   Medication will be filled on 06/15/24.    Appointment 06/17/24

## 2024-06-17 ENCOUNTER — Ambulatory Visit: Payer: Self-pay | Admitting: *Deleted

## 2024-06-17 DIAGNOSIS — B20 Human immunodeficiency virus [HIV] disease: Secondary | ICD-10-CM

## 2024-06-17 MED ORDER — CABOTEGRAVIR & RILPIVIRINE ER 600 & 900 MG/3ML IM SUER
1.0000 | Freq: Once | INTRAMUSCULAR | Status: AC
  Administered 2024-06-17: 1 via INTRAMUSCULAR

## 2024-06-17 NOTE — Progress Notes (Signed)
    Patient presents for Cabenuva  injections   States feeling well - yes.   Target date is 25th of each month   Last injection received 04/23/24.   Patient is within 7 day window before or after target date for injections - yes.   Unable to use VG sites 2/2 body habitus   Rilpivirine  900 mg Given IM RUOQ. Patient tolerated well.   Cabotegravir  600 given IM LUOQ. Patient tolerated well.   Patient waited 10 minutes in clinic following injections without incident.  Next injections are due 08/19/24.   Pierre Cumpton,RN

## 2024-07-12 ENCOUNTER — Telehealth: Payer: Self-pay | Admitting: Infectious Diseases

## 2024-07-12 NOTE — Telephone Encounter (Signed)
 Called pt, no VM.  Wanted to explain his mammogram- normal with gynecomastia, and to explain gynecomastia.  Will call back

## 2024-07-21 ENCOUNTER — Telehealth: Payer: Self-pay | Admitting: Infectious Diseases

## 2024-07-21 DIAGNOSIS — N62 Hypertrophy of breast: Secondary | ICD-10-CM

## 2024-07-21 NOTE — Telephone Encounter (Signed)
 Called pt about his mammogram, ultrasound.  Gynecomastia.  He still has tenderness,  Will check his testosterone to start.  He denies testicular masses.

## 2024-08-04 ENCOUNTER — Other Ambulatory Visit: Payer: Self-pay

## 2024-08-10 ENCOUNTER — Other Ambulatory Visit (INDEPENDENT_AMBULATORY_CARE_PROVIDER_SITE_OTHER): Payer: Self-pay

## 2024-08-10 ENCOUNTER — Ambulatory Visit: Admitting: *Deleted

## 2024-08-10 ENCOUNTER — Other Ambulatory Visit (HOSPITAL_COMMUNITY): Payer: Self-pay

## 2024-08-10 ENCOUNTER — Other Ambulatory Visit: Payer: Self-pay

## 2024-08-10 DIAGNOSIS — N62 Hypertrophy of breast: Secondary | ICD-10-CM | POA: Diagnosis not present

## 2024-08-10 NOTE — Progress Notes (Signed)
 Unable to get Cabenova injection as insurance would not cover until today.  Will be shipped from Pharmacy later today.  May arrive tomorrow or Thursday.

## 2024-08-10 NOTE — Addendum Note (Signed)
 Addended by: ANTONE DWAYNE SAILOR on: 08/10/2024 08:32 AM   Modules accepted: Orders

## 2024-08-10 NOTE — Progress Notes (Signed)
 Specialty Pharmacy Refill Coordination Note  Cole Thomas is a 43 y.o. male assessed today regarding refills of clinic administered specialty medication(s) Cabotegravir  & Rilpivirine  (CABENUVA )  [name] from clinic called  Clinic requested Courier to Provider Office   Delivery date: 08/11/24   Verified address: [facility] [address] [address]   Medication will be filled on 09.16.25.

## 2024-08-11 ENCOUNTER — Telehealth: Payer: Self-pay | Admitting: *Deleted

## 2024-08-11 LAB — TESTOSTERONE: Testosterone: 626 ng/dL (ref 264–916)

## 2024-08-11 NOTE — Telephone Encounter (Signed)
 Copied from CRM [id number]. Topic: General - Other >> Aug 11, 2024  8:04 AM Farrel B wrote: Reason for CRM: [phone], patient called to speak with Nurse [name] in regard to his appt yesterday

## 2024-08-13 ENCOUNTER — Ambulatory Visit

## 2024-08-13 DIAGNOSIS — B20 Human immunodeficiency virus [HIV] disease: Secondary | ICD-10-CM

## 2024-08-13 MED ORDER — CABOTEGRAVIR & RILPIVIRINE ER 600 & 900 MG/3ML IM SUER
1.0000 | Freq: Once | INTRAMUSCULAR | Status: AC
  Administered 2024-08-13: 1 via INTRAMUSCULAR

## 2024-08-13 NOTE — Progress Notes (Signed)
    Patient presents for Cabenuva  injections   States feeling well   Target date is 25th of each month   Last injection received 06/17/2024   Patient is within 7 day window before or after target date for injections   Unable to use VG sites 2/2 body habitus   Rilpivirine  900 mg Given IM RUOQ. Patient tolerated well.   Cabotegravir  600 given IM LUOQ. Patient tolerated well.   Patient waited 10 minutes in clinic following injections without incident.  Next injections are due 10/19/2024   .Cole Coval Cassady9/19/20258:52 AM

## 2024-08-24 ENCOUNTER — Ambulatory Visit

## 2024-09-28 ENCOUNTER — Other Ambulatory Visit (HOSPITAL_COMMUNITY): Payer: Self-pay

## 2024-10-05 ENCOUNTER — Other Ambulatory Visit: Payer: Self-pay | Admitting: Infectious Diseases

## 2024-10-05 ENCOUNTER — Telehealth: Payer: Self-pay | Admitting: Infectious Diseases

## 2024-10-05 ENCOUNTER — Other Ambulatory Visit: Payer: Self-pay

## 2024-10-05 ENCOUNTER — Telehealth: Payer: Self-pay | Admitting: *Deleted

## 2024-10-05 DIAGNOSIS — B20 Human immunodeficiency virus [HIV] disease: Secondary | ICD-10-CM

## 2024-10-05 MED ORDER — CABOTEGRAVIR & RILPIVIRINE ER 600 & 900 MG/3ML IM SUER
1.0000 | INTRAMUSCULAR | 0 refills | Status: AC
  Filled 2024-10-05 – 2024-10-06 (×2): qty 6, 56d supply, fill #0
  Filled 2024-12-14: qty 6, 56d supply, fill #1

## 2024-10-05 NOTE — Telephone Encounter (Signed)
 Spoke with patient.  Is scheduled for a Cabenuva  Injection and message was sent to Dr. Eben to order medication and send to [facility].

## 2024-10-05 NOTE — Telephone Encounter (Signed)
 Patient was called, but needed clarity on when injection is due and if we will have it in the office.  Call transferred to [facility], our triage nurse to finish up call.  Copied from CRM #[id number]. Topic: Appointments - Scheduling Inquiry for Clinic >> Sep 28, 2024 10:18 AM Alfonso ORN wrote: Reason for CRM: patient request to get nurse visit for a injection  Want to know if get an appointment on 10/15/24 in morning if possible  This is earliest he can get      ----------------------------------------------------------------------- From previous Reason for Contact - Scheduling: Patient/patient representative is calling to schedule an appointment. Refer to attachments for appointment information. >> Oct 04, 2024 10:41 AM Debby BROCKS wrote: Patient is following up to see if someone can reach out for the scheduling

## 2024-10-05 NOTE — Telephone Encounter (Signed)
 Call from patient to schedule his Cabenova injection for 10/15/2024.  Need to have order sent to the [facility] for delivery by 10/15/2024.

## 2024-10-06 ENCOUNTER — Other Ambulatory Visit: Payer: Self-pay

## 2024-10-06 NOTE — Progress Notes (Signed)
 Specialty Pharmacy Refill Coordination Note  Kobey Sides is a 43 y.o. male assessed today regarding refills of clinic administered specialty medication(s) Cabotegravir  & Rilpivirine  (CABENUVA )   Clinic requested Courier to Provider Office   Delivery date: 10/12/24   Verified address: [facility] [id number]   Medication will be filled on: 10/11/24  Copay: $0.00 Appointment: 11.21.25

## 2024-10-11 ENCOUNTER — Other Ambulatory Visit: Payer: Self-pay

## 2024-10-15 ENCOUNTER — Ambulatory Visit: Admitting: *Deleted

## 2024-10-15 DIAGNOSIS — B20 Human immunodeficiency virus [HIV] disease: Secondary | ICD-10-CM | POA: Diagnosis not present

## 2024-10-15 MED ORDER — CABOTEGRAVIR & RILPIVIRINE ER 600 & 900 MG/3ML IM SUER
1.0000 | Freq: Once | INTRAMUSCULAR | Status: AC
  Administered 2024-10-15: 1 via INTRAMUSCULAR

## 2024-10-15 NOTE — Progress Notes (Signed)
    Patient presents for Cabenuva  injections   States feeling well   Target date is 25th of each month   Last injection received 08/13/2024   Patient is within 7 day window before or after target date for injections   Unable to use VG sites 2/2 body habitus   Rilpivirine  900 mg Given IM RUOQ. Patient tolerated well.   Cabotegravir  600 given IM LUOQ. Patient tolerated well.   Patient waited 10 minutes in clinic following injections without incident.  Next injections are due 12/19/2024

## 2024-12-06 ENCOUNTER — Telehealth: Payer: Self-pay | Admitting: Infectious Diseases

## 2024-12-06 NOTE — Telephone Encounter (Signed)
 Pt stated he can come Friday 1/23 @ 0900 AM for cabenuva  injection.

## 2024-12-06 NOTE — Telephone Encounter (Signed)
 Message has been forwarded to our triage nurse for approval.  Copied from CRM [id number]. Topic: Appointments - Scheduling Inquiry for Clinic >> Dec 02, 2024 11:04 AM Chiquita SQUIBB wrote: Reason for CRM: Patient is calling in to schedule his Cabenuva  injection, patient is asking if this can be scheduled on 01/23 or 01/22, patient is asking if this can be done between 8:00 and 9:00 AM. Patient stated the office typically is able to get him around these times.

## 2024-12-14 ENCOUNTER — Other Ambulatory Visit: Payer: Self-pay

## 2024-12-14 ENCOUNTER — Other Ambulatory Visit (HOSPITAL_COMMUNITY): Payer: Self-pay

## 2024-12-14 NOTE — Progress Notes (Signed)
 Specialty Pharmacy Refill Coordination Note  Clear Bag Patient  Cole Thomas is a 44 y.o. male contacted today regarding refills of specialty medication(s) Cabotegravir  & Rilpivirine  (CABENUVA )  Injection appointment: 12/17/24  Patient requested: Courier to Provider Office   Delivery date: 12/15/24   Verified address: [facility] [address] [place] [address]  Medication will be filled on 12/14/24 .

## 2024-12-15 ENCOUNTER — Other Ambulatory Visit: Payer: Self-pay

## 2024-12-16 ENCOUNTER — Other Ambulatory Visit: Payer: Self-pay

## 2024-12-17 ENCOUNTER — Other Ambulatory Visit (HOSPITAL_COMMUNITY): Payer: Self-pay

## 2024-12-17 ENCOUNTER — Ambulatory Visit: Admitting: *Deleted

## 2024-12-17 ENCOUNTER — Other Ambulatory Visit: Payer: Self-pay

## 2024-12-17 ENCOUNTER — Ambulatory Visit: Payer: Self-pay

## 2024-12-17 ENCOUNTER — Telehealth: Payer: Self-pay | Admitting: *Deleted

## 2024-12-17 DIAGNOSIS — B20 Human immunodeficiency virus [HIV] disease: Secondary | ICD-10-CM | POA: Diagnosis not present

## 2024-12-17 MED ORDER — CABOTEGRAVIR & RILPIVIRINE ER 600 & 900 MG/3ML IM SUER
1.0000 | Freq: Once | INTRAMUSCULAR | Status: AC
  Administered 2024-12-17: 1 via INTRAMUSCULAR

## 2024-12-17 NOTE — Progress Notes (Signed)
" ° ° °  Patient presents for Cabenuva  injections   States feeling well - yes.   Target date is 25th of each month   Last injection received 10/15/2024.   Patient is within 7 day window before or after target date for injections   Unable to use VG sites 2/2 body habitus   Rilpivirine  900 mg Given IM RUOQ. Patient tolerated well.   Cabotegravir  600 given IM LUOQ. Patient tolerated well.   Patient waited 10 minutes in clinic following injections without incident.  Next injections are due 02/16/2025.   Dina Warbington,RN  "

## 2024-12-17 NOTE — Telephone Encounter (Signed)
 Call to patient informed him that the Cabenuva  has not arrived.  Message was let for patient to call to reschedule appointment. Patient returned call and was informed that the medication had not arrived in the Clinics.  Call to pharmacy to check on Pharmacist to recheck on status.   Copied from CRM #[id number]. Topic: Clinical - Medication Question >> Dec 17, 2024  8:24 AM Diannia H wrote: Reason for CRM: Please call the patient, he is trying to see if his medicine has arrived before he comes to his appointment. His callback number is [phone]. I tried calling the clinic a few times and I informed him everyone is with patients right now.

## 2024-12-24 ENCOUNTER — Ambulatory Visit
